# Patient Record
Sex: Female | Born: 1955 | Race: White | Hispanic: No | Marital: Married
Health system: Midwestern US, Academic
[De-identification: ages and names within clinical notes are randomized; demographics above are authoritative.]

## PROBLEM LIST (undated history)

## (undated) LAB — HM COLONOSCOPY: HM Colonoscopy: NORMAL

## (undated) LAB — HM MAMMOGRAPHY

## (undated) LAB — HM PAP SMEAR

---

## 2011-03-21 ENCOUNTER — Inpatient Hospital Stay

## 2011-04-01 ENCOUNTER — Ambulatory Visit: Admit: 2011-04-01 | Payer: PRIVATE HEALTH INSURANCE | Attending: Neurology

## 2011-04-01 DIAGNOSIS — M79603 Pain in arm, unspecified: Secondary | ICD-10-CM

## 2011-04-01 NOTE — Unmapped (Signed)
(  No note.)

## 2011-04-03 NOTE — Unmapped (Signed)
Patient name:  Elaine [name], Elaine [name]  Patient DOB:  [id number]  Date of Study:  04/01/11  Referring Physician:  Dr. Ane Payment    Indication for Referral:  The patient was evaluated for right and left arm mononeuropathies and cervical radiculopathies.    Summary:  The right median palmar sensory distal latency was prolonged. Otherwise, the nerve conduction studies of the upper extremities were normal. The needle exam showed mildly enlarged motor unit potentials in the right triceps muscle. Otherwise, the needle exam of the arms was normal.     Interpretation:   There is EMG evidence of a mild right median neuropathy at the wrist (carpal tunnel syndrome). The mild motor unit potential changes in the right triceps muscle are of uncertain clinical significance but may reflect an old right C7 radiculopathy.      Domenick Gong, M.D.  JGQ/md  T:  04/03/11    Proofed electronically by Dr. Gerlean Ren Conduction Studies (normal values for skin temp. at 34 degrees C)    Motor          Amplitude    Latency     Velocity   F-wave       Comments                 (mV)         (msec)      (m/sec)    (msec)    R median        7.1 (>3.9)  4.4 (<4.5)  54 (>48)  27.6 (<32)   R ulnar        14.8 (>6.5)  2.8 (<3.4)  57 (>53)  28.0 (<33)  L median        8.3 (>3.9)  3.9 (<4.5)  59 (>48)  27.7 (<32)   L ulnar        18.7 (>6.5)  3.0 (<3.4)  62 (>53)  26.1 (<33)    Sensory        Amplitude    Latency     Velocity   F-wave       Comments                 (uV)         (msec)      (m/sec)    (msec)    R med palmar    85 (>49.0)  2.6 (<2.3)  R uln palmar    35 (>15.0)  2.2 (<2.3)  L med palmar   115 (>49.0)  2.2 (<2.3)  L uln palmar    32 (>15.0)  2.0 (<2.3)      Needle Electrode Studies    Muscle IA Fib Fasc Normal Recruitment Dur Amp Turns Comments   L 1st dorsal int nl 0 0 nl        L abd poll brev nl 0 0 nl        L ext dig comm nl 0 0 nl        L pronator teres nl 0 0 nl        L triceps nl 0 0 nl        L deltoid nl 0 0 nl        L cervical  parasp nl 0 0 --                    R 1st dorsal int nl 0 0 nl  R abd poll brev nl 0 0 nl        R ext dig comm nl 0 0 nl        R pronator teres nl 0 0 nl        R triceps nl 0 0   + +     R deltoid nl 0 0 nl        R cervical parasp nl 0 0 --

## 2011-04-04 NOTE — Unmapped (Signed)
Please fax patients EMG results to Dr.Suna at [phone]

## 2011-04-11 ENCOUNTER — Ambulatory Visit: Admit: 2011-04-11 | Payer: PRIVATE HEALTH INSURANCE | Attending: Neurology

## 2011-04-11 DIAGNOSIS — F32A Depression, unspecified: Secondary | ICD-10-CM

## 2011-04-11 NOTE — Unmapped (Signed)
Subjective:      Patient ID: Elaine [name] is a 55 y.o. female.    I saw Elaine [name] today as a consultation requested by Dr. Aletha Halim for arm pain.     HPI Comments: The hand pain is greater on the left than right side. It started more than 10 yrs ago. Has been constant for the last 6 months. It increases  With gardening and using hands and also at work. Flicking hands used to help her symptoms. Currently she rubs her hands. Symptoms awake her frequently at night. She had 4 EMGs in the past. The last EMG done last week showed a mild right CTS and MUP changes in right triceps of unclear significance.     The neck pain is described more as a discomfort and it is very intermittent. She has no radiating pain into arms.     Elaine [name] also reported that her memory appears to be abnormal over the last year. She has joint stiffness and daily headaches. The headaches feel as her head is heavy.    Neck Pain   This is a chronic problem. The current episode started more than 1 year ago. The problem occurs intermittently. The problem has been waxing and waning. The pain is associated with nothing. The quality of the pain is described as aching. The pain is at a severity of 3/10. The pain is mild. Stiffness is present in the morning. Associated symptoms include headaches and tingling. Pertinent negatives include no chest pain, numbness or weakness. She has tried chiropractic manipulation for the symptoms. The treatment provided no relief.   Arm Pain   The incident occurred more than 1 week ago. The incident occurred at work. The injury mechanism was repetitive motion. The pain is present in the left hand and right hand. The quality of the pain is described as burning, aching and stabbing. The pain does not radiate. The pain is at a severity of 9/10. The pain is moderate. The pain has been fluctuating since the incident. Associated symptoms include tingling. Pertinent negatives include no chest pain or numbness. The symptoms are  aggravated by movement. She has tried immobilization for the symptoms. The treatment provided mild relief.   The hand pain is greater on the left than right side. It started more than 10 yrs ago. Has been constant for the last 6 months. It increases  With gardening and using hands and also at work. Flicking hands used to help her symptoms. Currently she rubs her hands. Symptoms awake her frequently at night. She had 4 EMGs in the past. The last EMG done last week showed a mild right CTS and MUP changes in right triceps of unclear significance.               Histories:     She has a past medical history of depression and ADD. Alcohol abuse.    She has a past surgical history of C section.    Her family history includes Cancer in her father and mother. Her family history is also strongly positive for depression.    She reports that she quit smoking about 4 months ago. She does not have any smokeless tobacco history on file. She reports that she does not drink alcohol or use illicit drugs. She quit drinking 3 weeks ago. Used to drink 1-2 glasses of wine and at times till she blacks out, currently she is enrolled in [place]. She used to smoke less than 1 PPD for 15  years.        Review of Systems   HENT: Positive for neck pain and neck stiffness.    Eyes: Positive for visual disturbance.   Respiratory: Negative for shortness of breath.    Cardiovascular: Negative for chest pain.   Gastrointestinal: Negative for diarrhea and constipation.   Genitourinary: Negative for difficulty urinating.   Musculoskeletal: Positive for arthralgias.   Neurological: Positive for tingling, light-headedness and headaches. Negative for dizziness, tremors, seizures, syncope, facial asymmetry, speech difficulty, weakness and numbness.   Psychiatric/Behavioral: Negative for confusion.       Allergies:   Morphine    Medications:     Outpatient Encounter Prescriptions as of 04/11/2011   Medication Sig Dispense Refill   ??? amphetamine-dextroamphetamine  (ADDERALL) 20 mg Tab Take 20 mg by mouth every morning.         ??? FLUoxetine (PROZAC) 40 MG capsule Take 40 mg by mouth daily.              Objective:       Blood pressure 90/60, pulse 60, height 5' 3 (1.6 m), weight 148 lb (67.132 kg).    Neurologic Exam     Mental Status   Oriented to person, place, and time.       Registration: recalls 3 of 3 objects. Recall at 5 minutes: recalls 3 of 3 objects. Follows 3 step commands.   Attention: normal. Concentration: normal.   Speech: speech is normal   Level of consciousness: alert  Knowledge: good. Able to perform simple calculations.   Able to name object. Able to read. Able to repeat. Able to write. Normal comprehension.     Cranial Nerves   Cranial nerves II through XII intact.      CN II   Visual acuity: normal with correction  Right visual field deficit: none  Left visual field deficit: none      CN III, IV, VI   Pupils are equal, round, and reactive to light.  Extraocular motions are normal.      CN V   Facial sensation intact.   Right facial sensation deficit: none  Left facial sensation deficit: none     CN VII   Facial expression full, symmetric.   Right facial weakness: none  Left facial weakness: none     CN VIII   CN VIII normal.   Hearing: intact     CN IX, X   CN IX normal.   CN X normal.      CN XI   CN XI normal.      CN XII   CN XII normal.     Motor Exam   Muscle bulk: normal  Overall muscle tone: normal     Strength   Strength 5/5 throughout.     Sensory Exam   Light touch normal.   Vibration normal.   Proprioception normal.   Pinprick normal.     Gait, Coordination, and Reflexes      Gait  Gait: normal     Coordination   Romberg: negative  Finger to nose coordination: normal  Heel to shin coordination: normal  Tandem walking coordination: normal     Tremor   Resting tremor: absent  Intention tremor: absent  Action tremor: absent     Reflexes   Right brachioradialis: 2+  Left brachioradialis: 2+  Right biceps: 2+  Left biceps: 2+  Right triceps:  2+  Left triceps: 2+  Right patellar: 2+  Left patellar: 2+  Right achilles: 2+  Left achilles: 2+  Right grip: 2+  Left grip: 2+  Right plantar: normal  Left plantar: normal  Right Hoffman: absent  Left Hoffman: absent      Physical Exam   Constitutional: She is oriented to person, place, and time. She appears well-developed and well-nourished.   HENT:   Head: Normocephalic and atraumatic.   Eyes: EOM are normal. Pupils are equal, round, and reactive to light.   Neck: Normal range of motion. Neck supple.   Cardiovascular: Normal rate, regular rhythm and normal heart sounds.    Pulmonary/Chest: Effort normal and breath sounds normal.   Abdominal: Soft. Bowel sounds are normal.   Neurological: She is oriented to person, place, and time. She has normal strength. She has a normal Finger-Nose-Finger Test, a normal Heel to [employer], a normal Romberg Test and a normal Tandem Gait Test. Gait normal.   Reflex Scores:       Tricep reflexes are 2+ on the right side and 2+ on the left side.       Bicep reflexes are 2+ on the right side and 2+ on the left side.       Brachioradialis reflexes are 2+ on the right side and 2+ on the left side.       Patellar reflexes are 2+ on the right side and 2+ on the left side.       Achilles reflexes are 2+ on the right side and 2+ on the left side.  Skin: Skin is warm.   Psychiatric: Her speech is normal.        She is tearful and questioned me several times if I think she is crazy.        Prior Diagnostic Testing:   As per HPI.       Assessment:     1- Bilateral Hand pain radiating to elbow. This is caused by mild right carpal tunnel syndrome.  2- Generalized somatic symptoms including neck discomfort, joint stiffness in the morning, memory complaints, weight gain, daily headaches, intermittent lightheadedness likely due to depression.    Plan:     1-Use wrist braces regularly at night and consider doing light work.  2-Reassurance that her neurologic exam is normal mitigating against a  sinister neurologic diagnosis.  3-Referral to a psychiatrist for management of depression.  4-Follow up on as needed basis.

## 2011-06-07 ENCOUNTER — Encounter: Payer: PRIVATE HEALTH INSURANCE | Attending: Neurology

## 2012-09-28 ENCOUNTER — Inpatient Hospital Stay: Admit: 2012-09-28 | Payer: PRIVATE HEALTH INSURANCE

## 2012-09-28 ENCOUNTER — Ambulatory Visit: Payer: PRIVATE HEALTH INSURANCE

## 2012-09-28 DIAGNOSIS — M5020 Other cervical disc displacement, unspecified cervical region: Secondary | ICD-10-CM

## 2012-09-28 MED ORDER — MAGNEVIST (gadopentetate dimeglumine) 13 mL
469.01 | Freq: Once | INTRAVENOUS | Status: AC | PRN
Start: 2012-09-28 — End: 2012-09-28
  Administered 2012-09-28: 16:00:00 via INTRAVENOUS

## 2012-10-01 NOTE — Unmapped (Signed)
Pt would like to know if Dr Nash Dimmer can review her MRI that she had on Friday 3.7.14 at [facility]. She would like his opinion since he did her EMG back in September

## 2012-10-01 NOTE — Unmapped (Signed)
Phoned patient to let her know Dr. Nash Dimmer would be unable to do this.

## 2012-10-08 ENCOUNTER — Ambulatory Visit: Admit: 2012-10-08 | Payer: PRIVATE HEALTH INSURANCE | Attending: Neurology

## 2012-10-08 DIAGNOSIS — M79609 Pain in unspecified limb: Secondary | ICD-10-CM

## 2012-10-08 NOTE — Unmapped (Signed)
Indication for Referral:  The patient was evaluated for right upper extremity mononeuropathies and radiculopathies.     Summary:  The right median sensory latency was prolonged.  The nerve conduction studies were otherwise normal.  Enlarged motor units were seen in the right abductor pollicis brevis, flexor carpi radialis, extensor digitorum communis and triceps muscles.  The remainder of the needle exam was normal.      Interpretation:  The EMG was abnormal.  The EMG findings best fit with:   1. A chronic right C7 radiculopathy.   2. A mild median mononeuropathy at the wrist (aka carpal tunnel).         - - - - - - - - - - - - - - - - - - - - - - - - - - - - - -  -  Randen Kauth, MD  Diplomate, American Board of Electrodiagnostic Medicine      Sensory NCS      Peak  Amp.1-2 Dist. Vel.      ms ms ??V cm m/s   R MEDIAN PALMAR - ULNAR PALMAR   1. median    palm 1.95 2.70 64.5     2. ulnar      palm 1.40 2.00 32.8         Motor NCS      Nerve / Sites Latency Ampl. Distance Velocity    ms mV cm m/s   R MEDIAN - thenar   1. Wrist 4.20 6.3 7    2. Elbow 8.40 6.0 23 54.8   Path 3 - 1       R ULNAR - Hypothenar   1. Wrist 3.30 14.7 6.5    2. B.Elbow 7.30 14.3 25 62.5   Path 3 - 1           EMG Summary Table     Spontaneous MUAP Recruitment Other    IA Fib PSW Fasc Amp Dur. PPP Pattern Other   R. FIRST D INTEROSS N None None None N N N N None   R. ABD POLL BREVIS N None None None 1+ N N N None   R. FLEX CARPI RAD N None None None 2+ 2+ 1+ N None   R. EXT DIG COMM N None None None 1+ 2+ 1+ N None   R. TRICEPS N None None None 2+ 2+ 1+ N None   R. DELTOID N None None None N N N N None   R. BICEPS N None None None N N N N None   R. CERV PSPINAL N None None None     None       NERVE CONDUCTION STUDY NORMAL VALUES (skin temp. 34??? C)  MOTOR  AMP LAT COND. VEL. F-WAVE       SENSORY AMP LAT COND. VEL.  (mV) (msec)   (m/sec)  (msec)   (mV) (msec)   (m/sec)  Median (>3.9) (<4.5)   (>48)  (<32)       Radial  (>20) (<2.9)   (>   )           Ulnar (>6.5) (<3.4)   (>53)  (<33)       Median (>24.9) (<3.6)   (>56)   Peroneal (>1.9) (<7.1)   (>40)  (< 59)        Ulnar  (>9.6) (<3.0)   (>54)  Tibial (> 3.9) (<5.9)   (>39)  (<57 )       Sural  (>  5.9) (<4.1)   (>    )

## 2012-11-27 ENCOUNTER — Inpatient Hospital Stay: Admit: 2012-11-27 | Payer: PRIVATE HEALTH INSURANCE

## 2012-11-27 DIAGNOSIS — Z01810 Encounter for preprocedural cardiovascular examination: Secondary | ICD-10-CM

## 2013-02-18 ENCOUNTER — Inpatient Hospital Stay: Admit: 2013-02-18 | Payer: PRIVATE HEALTH INSURANCE

## 2013-02-18 DIAGNOSIS — Z9889 Other specified postprocedural states: Secondary | ICD-10-CM

## 2014-01-07 ENCOUNTER — Ambulatory Visit: Admit: 2014-01-07 | Discharge: 2014-01-07 | Payer: PRIVATE HEALTH INSURANCE

## 2014-01-07 DIAGNOSIS — G2581 Restless legs syndrome: Secondary | ICD-10-CM

## 2014-01-07 MED ORDER — ALPRAZolam (XANAX) 0.25 MG tablet
0.25 | ORAL_TABLET | Freq: Every evening | ORAL | Status: AC | PRN
Start: 2014-01-07 — End: 2014-04-22

## 2014-01-07 MED ORDER — celecoxib (CELEBREX) 200 MG capsule
200 | ORAL_CAPSULE | Freq: Every day | ORAL | Status: AC
Start: 2014-01-07 — End: 2014-07-08

## 2014-01-07 MED ORDER — FLUoxetine (PROZAC) 20 MG capsule
20 | ORAL_CAPSULE | Freq: Two times a day (BID) | ORAL | Status: AC
Start: 2014-01-07 — End: 2014-04-22

## 2014-01-08 NOTE — Unmapped (Signed)
stable °

## 2014-01-08 NOTE — Unmapped (Signed)
Xanax hs

## 2014-01-08 NOTE — Unmapped (Signed)
Screening reviewed ordered

## 2014-01-08 NOTE — Unmapped (Signed)
Subjective  HPI:   Patient ID: Elaine [name] is a 58 y.o. female.    Chief Complaint:  HPI Comments: New pt h/o depression restless legs oa, add?             Medications:  Current Outpatient Prescriptions   Medication Sig Dispense Refill   ??? ALPRAZolam (XANAX) 0.25 MG tablet Take 1 tablet (0.25 mg total) by mouth at bedtime as needed.  30 tablet  3   ??? celecoxib (CELEBREX) 200 MG capsule Take 1 capsule (200 mg total) by mouth daily.  30 capsule  5   ??? FLUoxetine (PROZAC) 20 MG capsule Take 1 capsule (20 mg total) by mouth 2 times a day.  60 capsule  11     No current facility-administered medications for this visit.        ROS:   Review of Systems   Constitutional: Negative for fatigue.   All other systems reviewed and are negative.         Objective:   Physical Exam          Filed Vitals:    01/07/14 0934   BP: 104/82   Pulse: 72   Resp: 12   Height: 5' 4.9 (1.648 m)   Weight: 153 lb (69.4 kg)     Body mass index is 25.55 kg/(m^2).  Body surface area is 1.78 meters squared.                Assessment/Plan:     Patient Active Problem List   Diagnosis   ??? Reactive airway disease   ??? Depression   ??? Restless legs        BP 104/82   Pulse 72   Resp 12   Ht 5' 4.9 (1.648 m)   Wt 153 lb (69.4 kg)   BMI 25.55 kg/m2    General Appearance:    Alert, cooperative, no distress, appears stated age   Head:    Normocephalic, without obvious abnormality, atraumatic   Eyes:    PERRL, conjunctiva/corneas clear, EOM's intact, fundi     benign, both eyes   Ears:    Normal TM's and external ear canals, both ears   Nose:   Nares normal, septum midline, mucosa normal, no drainage    or sinus tenderness   Throat:   Lips, mucosa, and tongue normal; teeth and gums normal   Neck:   Supple, symmetrical, trachea midline, no adenopathy;     thyroid:  no enlargement/tenderness/nodules; no carotid    bruit or JVD   Back:     Symmetric, no curvature, ROM normal, no CVA tenderness   Lungs:     Clear to auscultation bilaterally, respirations  unlabored   Chest Wall:    No tenderness or deformity    Heart:    Regular rate and rhythm, S1 and S2 normal, no murmur, rub   or gallop       Abdomen:     Soft, non-tender, bowel sounds active all four quadrants,     no masses, no organomegaly           Extremities:   Extremities normal, atraumatic, no cyanosis or edema   Pulses:   2+ and symmetric all extremities   Skin:   Skin color, texture, turgor normal, no rashes or lesions

## 2014-01-08 NOTE — Unmapped (Signed)
prozac 

## 2014-04-15 ENCOUNTER — Inpatient Hospital Stay: Admit: 2014-04-15 | Payer: PRIVATE HEALTH INSURANCE

## 2014-04-15 ENCOUNTER — Inpatient Hospital Stay: Admit: 2014-04-15

## 2014-04-15 ENCOUNTER — Other Ambulatory Visit: Admit: 2014-04-15 | Payer: PRIVATE HEALTH INSURANCE

## 2014-04-15 DIAGNOSIS — Z0000 Encounter for general adult medical examination without abnormal findings: Secondary | ICD-10-CM

## 2014-04-15 DIAGNOSIS — Z1231 Encounter for screening mammogram for malignant neoplasm of breast: Secondary | ICD-10-CM

## 2014-04-15 LAB — RENAL FUNCTION PANEL W/EGFR
Albumin: 4.1 g/dL (ref 3.5–5.7)
Anion Gap: 4 mmol/L (ref 3–16)
BUN: 19 mg/dL (ref 7–25)
CO2: 31 mmol/L (ref 21–33)
Calcium: 9.5 mg/dL (ref 8.6–10.3)
Chloride: 104 mmol/L (ref 98–110)
Creatinine: 0.82 mg/dL (ref 0.60–1.30)
GFR MDRD Af Amer: 87 See note.
GFR MDRD Non Af Amer: 72 See note.
Glucose: 83 mg/dL (ref 70–100)
Osmolality, Calculated: 289 mOsm/kg (ref 278–305)
Phosphorus: 4.4 mg/dL (ref 2.1–4.7)
Potassium: 4.7 mmol/L (ref 3.5–5.3)
Sodium: 139 mmol/L (ref 133–146)

## 2014-04-15 LAB — CBC
Hematocrit: 42.1 % (ref 35.0–45.0)
Hemoglobin: 14.1 g/dL (ref 11.7–15.5)
MCH: 29 pg (ref 27.0–33.0)
MCHC: 33.5 g/dL (ref 32.0–36.0)
MCV: 86.5 fL (ref 80.0–100.0)
MPV: 8.9 fL (ref 7.5–11.5)
Platelets: 242 10E3/uL (ref 140–400)
RBC: 4.87 10E6/uL (ref 3.80–5.10)
RDW: 14.3 % (ref 11.0–15.0)
WBC: 5.3 10E3/uL (ref 3.8–10.8)

## 2014-04-15 LAB — HEPATIC FUNCTION PANEL, SERUM
ALT: 14 U/L (ref 7–52)
AST (SGOT): 19 U/L (ref 13–39)
Albumin: 4.1 g/dL (ref 3.5–5.7)
Alkaline Phosphatase: 55 U/L (ref 36–125)
Bilirubin, Direct: 0.07 mg/dL (ref 0.00–0.40)
Bilirubin, Indirect: 0.33 mg/dL (ref 0.00–1.10)
Total Bilirubin: 0.4 mg/dL (ref 0.0–1.5)
Total Protein: 6.5 g/dL (ref 6.4–8.9)

## 2014-04-15 LAB — IRON: Iron: 88 ug/dL (ref 50–212)

## 2014-04-15 LAB — THYROID PEROXIDASE ANTIBODY: Thyroid Peroxidase Ab: <10 IU/mL (ref 0.0–34.9)

## 2014-04-15 LAB — FERRITIN: Ferritin: 22.1 ng/mL (ref 11.0–306.8)

## 2014-04-15 LAB — FOLATE: Folic Acid: 14.9 ng/mL (ref 5.90–24.80)

## 2014-04-15 LAB — THYROGLOBULIN ANTIBODY: Thyroglobulin Ab: <20 IU/mL (ref 0.0–39.0)

## 2014-04-15 LAB — VITAMIN B12: Vitamin B-12: 425 pg/mL (ref 180–914)

## 2014-04-15 LAB — TSH: TSH: 1.32 u[IU]/mL (ref 0.34–5.60)

## 2014-04-22 ENCOUNTER — Ambulatory Visit: Admit: 2014-04-22 | Discharge: 2014-04-22 | Payer: PRIVATE HEALTH INSURANCE

## 2014-04-22 DIAGNOSIS — Z0000 Encounter for general adult medical examination without abnormal findings: Secondary | ICD-10-CM

## 2014-04-22 LAB — POCT URINALYSIS DIPSTICK, NONAUTOMATED; W/O MICRO
POCT - Bilirubin, UA: NEGATIVE
POCT - Glucose, UA: NEGATIVE
POCT - Ketones, UA: NEGATIVE
POCT - Leukocytes Esterase, UA: NEGATIVE
POCT - Nitrite, UA: NEGATIVE
POCT - Protein, UA: NEGATIVE
POCT - Specific Gravity, Urine: 1.01
POCT - Urobilinogen, UA: 0.2
POCT - pH, UA: 5

## 2014-04-22 MED ORDER — ALPRAZolam (XANAX) 0.25 MG tablet
0.25 | ORAL_TABLET | Freq: Every evening | ORAL | Status: AC | PRN
Start: 2014-04-22 — End: 2014-12-16

## 2014-04-22 MED ORDER — cyanocobalamin (VITAMIN B-12) injection 1,000 mcg
1000 | INTRAMUSCULAR | Status: AC
Start: 2014-04-22 — End: 2014-05-22
  Administered 2014-04-22: 14:00:00 1000 ug via INTRAMUSCULAR

## 2014-04-22 MED ORDER — folic acid-vit B6-vit B12 2.5-25-1 mg Tab
2.5-25-1 | Freq: Every day | ORAL | 2.00 refills | 30.00000 days | Status: AC
Start: 2014-04-22 — End: 2014-05-22

## 2014-04-22 MED ORDER — amoxicillin-clavulanate (AUGMENTIN) 875-125 mg per tablet
875-125 | ORAL_TABLET | Freq: Two times a day (BID) | ORAL | Status: AC
Start: 2014-04-22 — End: 2014-05-22

## 2014-04-22 MED ORDER — mometasone (NASONEX) 50 mcg/actuation nasal spray
50 | Freq: Every day | NASAL | Status: AC
Start: 2014-04-22 — End: 2014-04-22

## 2014-04-22 MED ORDER — fluticasone (FLONASE) 50 mcg/actuation nasal spray
50 | Freq: Every day | NASAL | Status: AC
Start: 2014-04-22 — End: 2014-07-02

## 2014-04-22 MED ORDER — FLUoxetine (PROZAC) 20 MG capsule
20 | ORAL_CAPSULE | Freq: Every day | ORAL | Status: AC
Start: 2014-04-22 — End: 2015-01-03

## 2014-04-22 MED ORDER — FLUoxetine (PROZAC) 10 MG capsule
10 | ORAL_CAPSULE | Freq: Every day | ORAL | Status: AC
Start: 2014-04-22 — End: 2014-06-23

## 2014-04-22 NOTE — Unmapped (Signed)
Prn xanax

## 2014-04-22 NOTE — Unmapped (Signed)
Check if better after augmentin, b12

## 2014-04-22 NOTE — Unmapped (Signed)
Subjective  HPI:   Patient ID: Elaine [name] is a 58 y.o. female.    Chief Complaint:  HPI Comments: cpe 2 weeks of feeling tired sleeps fine. Feels hot, sweaty keep gettting worse. Used to take adderall went off about 1 year ago she feels ok but bothers others. l nares feels plugged. celebrex helps oa             Medications:  Current Outpatient Prescriptions   Medication Sig Dispense Refill   ??? acyclovir (ZOVIRAX) 400 MG tablet Take 400 mg by mouth daily.       ??? ALPRAZolam (XANAX) 0.25 MG tablet Take 1 tablet (0.25 mg total) by mouth at bedtime as needed.  30 tablet  3   ??? celecoxib (CELEBREX) 200 MG capsule Take 1 capsule (200 mg total) by mouth daily.  30 capsule  5   ??? FLUoxetine (PROZAC) 20 MG capsule Take 1 capsule (20 mg total) by mouth 2 times a day.  60 capsule  11     No current facility-administered medications for this visit.        ROS:   Review of Systems   Constitutional: Positive for fatigue.   All other systems reviewed and are negative.         Objective:   Physical Exam   Constitutional: She is oriented to person, place, and time. She appears well-developed and well-nourished.   HENT:   Head: Normocephalic and atraumatic.   Nose: Nose normal.   Mouth/Throat: Oropharynx is clear and moist.   b tm effusion mild erythema, op erythema, swelling nasal mucosa left   Eyes: Conjunctivae and EOM are normal. Pupils are equal, round, and reactive to light.   Neck: Normal range of motion. Neck supple. No thyromegaly present.   Cardiovascular: Normal rate, regular rhythm, normal heart sounds and intact distal pulses.  Exam reveals no gallop and no friction rub.    No murmur heard.  Pulmonary/Chest: Effort normal and breath sounds normal.   Abdominal: Soft. Bowel sounds are normal. She exhibits no distension and no mass. There is no tenderness.   Musculoskeletal: Normal range of motion. She exhibits no edema.   Neurological: She is alert and oriented to person, place, and time. She has normal reflexes.      Skin: Skin is warm and dry. No rash noted.   Psychiatric: She has a normal mood and affect. Her behavior is normal.             Filed Vitals:    04/22/14 0857   BP: 94/66   Pulse: 64   Resp: 12   Height: 5' 3.6 (1.615 m)   Weight: 158 lb (71.668 kg)     Body mass index is 27.48 kg/(m^2).  Body surface area is 1.79 meters squared.                Assessment/Plan:     Patient Active Problem List   Diagnosis   ??? Reactive airway disease   ??? Depression   ??? Restless legs   ??? Healthcare maintenance

## 2014-04-22 NOTE — Unmapped (Signed)
Nasal spray you ordered is not available until November- can a new med be sent in

## 2014-04-22 NOTE — Unmapped (Signed)
augmentin

## 2014-04-22 NOTE — Unmapped (Signed)
Reduce prozac to 30 mg

## 2014-04-22 NOTE — Unmapped (Signed)
Screening reviewed ordered

## 2014-04-22 NOTE — Unmapped (Signed)
celebrex

## 2014-04-23 NOTE — Unmapped (Signed)
Pt. Informed rx sent to the pharmacy

## 2014-04-23 NOTE — Unmapped (Signed)
Lung Cancer Screening Program Intake Questions:   1. Referred by:  _____Amy Hovermale____________        2. Is this the baseline/initial screening or a follow-up Low Dose CT? ____Baseline____________  3. Date of Birth: _____October 20, 1957___________  4. Is this a current smoker or former smoker? _________former smoker_______________   When did the patient start smoking? _______in college_______________  5. How many packs per day? _____1.5______________  6. If Former Smoker, when did the patient quit smoking? ______2011____________  7. How many pack years? _______38 pack years______________________  Does the patient currently report and of the following symptoms? New onset cough, coughing up blood, chest pain or unintentional weight loss? ____patient said she have back pain from time to time___  ?? If symptomatic, please refer to RN for further evaluation prior to scheduling.  8. Has the patient has prior chest imaging (CTs, X-rays)? If so, where? ______about 10 years ago____  9.  Insurance: _____________________________   10.  Lung Screening Appointment Information    ?? Date: _________________________    ?? Time: _______________   ?? 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 Patient also stated she has been very fatigue the last week.

## 2014-04-28 NOTE — Unmapped (Signed)
Pt had blood work done she stated= Done on 09.22.2015- pt notice that her chlor was not done or horome= did you want her to do this? Call pt

## 2014-04-28 NOTE — Unmapped (Signed)
Lung Cancer Screening Program Assessment:   Referral: Patient was referred by PCP Dr. Gari Crown  Age: 58 yo  Smoking history: Patient began smoking in college.  She reports she quit for ten years then began smoking again.  Patient reports 25 year smoking history of 1.5 packs per day (37.5 pack years). Patient reports she has been smoke free for 4 years following permanent smoking cessation in 2011.  Relevant Medical History: Patient has history of Restless Leg Syndrome, depression, reactive airway disease and most recently increasing fatigue.  Personal History of CA:  Patient has no personal history of cancer.  Symptoms: Patient complains of chronic, raspy cough.  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   Family History of Lung CA:  Patient reports her grandfather had lung cancer.  Her grandfather lived to 15 yo.   Recommendations: Based on current lung screening guidelines, pt is a candidate for lung cancer screening program.   Action: Program explained, risk/benefits discussed, fees, discussed, eligibility assessed.Marland Kitchen   Response:  Patient verbalized understanding.  Insurance: Patient's insurance is Publishing copy scheduled: Appt tentatively scheduled at [employer] at [phone] on 05/08/2014].

## 2014-04-29 NOTE — Unmapped (Signed)
yes

## 2014-05-05 ENCOUNTER — Other Ambulatory Visit: Admit: 2014-05-05 | Payer: PRIVATE HEALTH INSURANCE

## 2014-05-05 DIAGNOSIS — Z0000 Encounter for general adult medical examination without abnormal findings: Secondary | ICD-10-CM

## 2014-05-05 LAB — LIPID PANEL
Cholesterol, Total: 217 mg/dL — ABNORMAL HIGH (ref 0–200)
HDL: 52 mg/dL (ref 23–92)
LDL Cholesterol: 148 mg/dL — ABNORMAL HIGH (ref 0–100)
Triglycerides: 86 mg/dL (ref 48–352)

## 2014-05-08 ENCOUNTER — Inpatient Hospital Stay: Admit: 2014-05-08 | Payer: PRIVATE HEALTH INSURANCE

## 2014-05-08 DIAGNOSIS — Z122 Encounter for screening for malignant neoplasm of respiratory organs: Secondary | ICD-10-CM

## 2014-05-20 NOTE — Unmapped (Signed)
D: Spoke with patient at 12:15 on 05/20/14. Patient had concerns with atherosclerosis noted on screening CT. Denies cardiovascular complaints; Reports regular PCP visits with Dr. Rico Junker    A: Reassurance provided to patient on the role lung cancer screening as a preventative screening to supplement the annual primary care visit in the prevention and management of disease. Education provided on cardiac/disease prevention and management, importance of regular primary care appointments, and anginal sign/symptoms. Encouraged discussion with PCP to medically correlate with PCP medical exam.     R: Pt states she will discuss with Dr. Rico Junker at her appointment on Thursday. Pt wants [place] to contact her next year to schedule her annual LDCT.

## 2014-05-22 ENCOUNTER — Ambulatory Visit: Admit: 2014-05-22 | Discharge: 2014-05-22 | Payer: PRIVATE HEALTH INSURANCE

## 2014-05-22 DIAGNOSIS — E785 Hyperlipidemia, unspecified: Secondary | ICD-10-CM

## 2014-05-22 MED ORDER — coenzyme Q10 100 mg capsule
100 | ORAL_CAPSULE | Freq: Every day | ORAL | 0.00 refills | 30.00000 days | Status: AC
Start: 2014-05-22 — End: ?

## 2014-05-22 MED ORDER — lovastatin (MEVACOR) 10 MG tablet
10 | ORAL_TABLET | Freq: Every day | ORAL | Status: AC
Start: 2014-05-22 — End: 2014-06-23

## 2014-05-22 MED ORDER — budesonide-formoterol (SYMBICORT) 80-4.5 mcg/actuation inhaler
80-4.5 | Freq: Two times a day (BID) | RESPIRATORY_TRACT | Status: AC
Start: 2014-05-22 — End: 2014-11-24

## 2014-05-22 NOTE — Unmapped (Signed)
gxt °

## 2014-05-22 NOTE — Unmapped (Signed)
Instructed pt to continue current meds

## 2014-05-22 NOTE — Unmapped (Signed)
Subjective  HPI:   Patient ID: Elaine [name] is a 58 y.o. female.    Chief Complaint:  HPI Comments: F/u calcifications thoracic aorta chol, oa, gets doe chest pain, f/u depression.Took chol meds in the past but got jt pain so stopped             Medications:  Current Outpatient Prescriptions   Medication Sig Dispense Refill   ??? acyclovir (ZOVIRAX) 400 MG tablet Take 400 mg by mouth daily.       ??? ALPRAZolam (XANAX) 0.25 MG tablet Take 1 tablet (0.25 mg total) by mouth at bedtime as needed.  30 tablet  3   ??? celecoxib (CELEBREX) 200 MG capsule Take 1 capsule (200 mg total) by mouth daily.  30 capsule  5   ??? FLUoxetine (PROZAC) 10 MG capsule Take 1 capsule (10 mg total) by mouth daily.  90 capsule  1   ??? FLUoxetine (PROZAC) 20 MG capsule Take 1 capsule (20 mg total) by mouth daily.  90 capsule  1   ??? fluticasone (FLONASE) 50 mcg/actuation nasal spray Use 1 spray into each nostril daily.  16 g  0   ??? budesonide-formoterol (SYMBICORT) 80-4.5 mcg/actuation inhaler Inhale 2 puffs into the lungs 2 times a day.  1 Inhaler  3   ??? coenzyme Q10 100 mg capsule Take 100 mg by mouth daily.  30 capsule  0   ??? lovastatin (MEVACOR) 10 MG tablet Take 1 tablet (10 mg total) by mouth daily with dinner.  30 tablet  3     No current facility-administered medications for this visit.        ROS:   Review of Systems   Constitutional: Negative for fatigue.   All other systems reviewed and are negative.         Objective:   Physical Exam   Constitutional: She appears well-developed and well-nourished.   HENT:   Head: Normocephalic and atraumatic.   Cardiovascular: Normal rate, regular rhythm and normal heart sounds.  Exam reveals no gallop and no friction rub.    No murmur heard.  Pulmonary/Chest: Effort normal and breath sounds normal.   Abdominal: Soft. Bowel sounds are normal.   Musculoskeletal: Normal range of motion.   Psychiatric: She has a normal mood and affect. Her behavior is normal.             Filed Vitals:    05/22/14 0908    BP: 94/62   Pulse: 60   Resp: 12     There is no weight on file to calculate BMI.  There is no height or weight on file to calculate BSA.                Assessment/Plan:     Patient Active Problem List   Diagnosis   ??? Reactive airway disease   ??? Depression   ??? Restless legs   ??? Healthcare maintenance   ??? OA (osteoarthritis)   ??? Acute ethmoidal sinusitis   ??? Tired

## 2014-05-22 NOTE — Unmapped (Signed)
Start lovastatin coq10 check vit d

## 2014-05-22 NOTE — Unmapped (Signed)
Xanax prn

## 2014-05-22 NOTE — Unmapped (Signed)
symbicort

## 2014-05-29 ENCOUNTER — Inpatient Hospital Stay: Admit: 2014-05-29 | Payer: PRIVATE HEALTH INSURANCE

## 2014-05-29 DIAGNOSIS — R079 Chest pain, unspecified: Secondary | ICD-10-CM

## 2014-05-29 MED ORDER — Tc-99m technetium tetrofosmin (MYOVIEW) 12.2 milli Curie
Freq: Once | INTRAVENOUS | Status: AC | PRN
Start: 2014-05-29 — End: 2014-05-29
  Administered 2014-05-29: 13:00:00 12.2 via INTRAVENOUS

## 2014-05-29 MED ORDER — Tc-99m technetium tetrofosmin (MYOVIEW) 33.9 milli Curie
Freq: Once | INTRAVENOUS | Status: AC | PRN
Start: 2014-05-29 — End: 2014-05-29
  Administered 2014-05-29: 15:00:00 33.9 via INTRAVENOUS

## 2014-06-10 NOTE — Unmapped (Signed)
Pt had a ecg -done at [facility] - done within the last month. Call pt with results.

## 2014-06-10 NOTE — Unmapped (Signed)
State in process? From 11-5 call about

## 2014-06-11 NOTE — Unmapped (Signed)
Called and spoke to [place], she will have someone look at it and call back

## 2014-06-16 NOTE — Unmapped (Signed)
ECG test is now final

## 2014-06-16 NOTE — Unmapped (Signed)
Patient informed

## 2014-06-16 NOTE — Unmapped (Signed)
.  nl

## 2014-06-23 ENCOUNTER — Ambulatory Visit: Admit: 2014-06-23 | Discharge: 2014-06-23 | Payer: PRIVATE HEALTH INSURANCE | Attending: Gynecologic Oncology

## 2014-06-23 DIAGNOSIS — N941 Dyspareunia: Secondary | ICD-10-CM

## 2014-06-23 NOTE — Unmapped (Signed)
Pt calling to discuss a surgery date.  Please call pt back.

## 2014-06-23 NOTE — Unmapped (Signed)
Chief Complaint   [name] presents with   ??? Consult     painful intercourse ref bt Dr. Graciela Husbands          History of Present Illness  Elaine [name] is a 58 year old woman referred by Dr. Graciela Husbands for evaluation of complaints of pelvic pain and dyspareunia for Elaine past 3 years.    Elaine [name] has a history of dyspareunia and pelvic pain for Elaine last 3 years.  Elaine [name] underwent a laparoscopic transperitoneal repair of inguinal hernias with mesh with lysis of adhesions in 2012, and noticed Elaine pain shortly after, unsure if related.  Elaine [name] reports that pelvic pain is a 10/10 and occurs with sexual intercourse every time.  Elaine [name] presented to Dr. Logan Bores office for evaluation in 05/2014, and began hormone pellets 1 week ago.  On 06/10/14, Elaine [name] returned to Elaine office with lower pelvic pain and dysuria, and was treated with antibiotics for a UTI.  A transvaginal ultrasound was performed on 06/13/14, revealing a normal uterus, small fibroid(s), ovaries not seen, and EML not well delineated.  Elaine [name] was then referred to discuss surgical management of symptoms.    Elaine [name] has a family history significant for ovarian cancer in her mother at age 83.  Elaine [name] has had BRCA testing herself, which was negative.  Elaine [name] reports that overall Elaine [name] is doing well. Elaine [name] reports a normal appetite, denying nausea or vomiting. Elaine [name] denies symptoms of abdominal or pelvic pain currently, but has pelvic pain that is intense every time with intercourse.  Elaine [name] also reports constant pelvic pressure.  Elaine [name] denies abdominal bloating, vaginal bleeding or discharge. Elaine [name] reports no changes in her bowel/bladder habits and has no new limitations in her activities, with chronic constipation versus IBS and chronic fatigue.  Denies fevers, chills, chest pain, shortness of breath, or cough.  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  Elaine [name] plans to travel to [place] in mid-January  2016.     Histories   Elaine [name] has a past medical history of Multiple gastric ulcers; Pancreatitis; Depression; and Eating disorder.    Elaine [name] has past surgical history that includes Cervical discectomy; Nasal septum surgery; carpal tunnel right; Hernia repair (Bilateral); uterine ablation; Knee arthroscopy w/ ACL reconstruction (Left); Cholecystectomy; Trigger finger release; Cesarean section (80, 82, 84); Breast surgery; and Tubal ligation.    Her family history includes ADD / ADHD in her son and son; Cancer in her father and mother; Depression in her brother, brother, sister, and sister.    Elaine [name] reports that Elaine [name] quit smoking about 3 years ago. Her smoking use included Cigarettes. Elaine [name] has a 20 pack-year smoking history. Elaine [name] does not have any smokeless tobacco history on file. Elaine [name] reports that Elaine [name] does not drink alcohol or use illicit drugs.    Allergies  Morphine    Medications  Current Outpatient Prescriptions   Medication Sig   ??? acyclovir Take 400 mg by mouth daily.   ??? budesonide-formoterol Inhale 2 puffs into Elaine lungs 2 times a day.   ??? celecoxib Take 1 capsule (200 mg total) by mouth daily.   ??? coenzyme Q10 Take 100 mg by mouth daily.   ??? FLUoxetine Take 1 capsule (20 mg total) by mouth daily.   ??? fluticasone Use 1 spray into each nostril daily.   ??? UNABLE TO FIND Med Name: Testerone pellets   ???  ALPRAZolam Take 1 tablet (0.25 mg total) by mouth at bedtime as needed.     No current facility-administered medications for this visit.       Elaine following portions of Elaine [name]'s history were reviewed and updated as appropriate: allergies, current medications, past family history, past medical history, past social history, past surgical history and problem list.    Review of Systems   Constitutional: Positive for fatigue. Negative for fever, chills, activity change and appetite change.   HENT: Negative for mouth sores and sore throat.    Eyes: Negative for photophobia and visual disturbance.   Respiratory: Negative for  cough, chest tightness and shortness of breath.    Cardiovascular: Negative for chest pain and leg swelling.   Gastrointestinal: Positive for abdominal pain, diarrhea and constipation. Negative for nausea, vomiting, abdominal distention and bloating.   Genitourinary: Positive for pelvic pain and dyspareunia. Negative for dysuria, frequency, vaginal bleeding, vaginal discharge, difficulty urinating and vaginal pain.   Musculoskeletal: Negative for back pain and arthralgias.   Skin: Negative for color change and pallor.   Neurological: Negative for dizziness, weakness, light-headedness and numbness.   Hematological: Negative for adenopathy.   Psychiatric/Behavioral: Negative for depression, confusion and agitation.       Vitals  Blood pressure 102/63, pulse 65, height 5' 4.5 (1.638 m), weight 162 lb (73.483 kg), SpO2 98 %.    Physical Exam   Vitals reviewed.  Constitutional: Elaine [name] is oriented to person, place, and time. Elaine [name] appears well-developed and well-nourished.   HENT:   Head: Normocephalic and atraumatic.   Eyes: Conjunctivae and EOM are normal.   Neck: Normal range of motion.   Cardiovascular: Normal rate, regular rhythm and normal heart sounds.    Pulmonary/Chest: Effort normal and breath sounds normal.   Abdominal: Soft. Elaine [name] exhibits no distension and no mass. There is no tenderness. There is no rebound and no guarding.   Genitourinary:   Normal external genitalia/vulva.  Normal urethral meatus, urethra, bladder, anus and perineum.  Normal vagina and cervix.  Normal retroverted uterus and adnexa.  Tenderness on bimanual exam.  Normal rectum.  There is no nodularity in Elaine posterior culdesac or rectovaginal septum.   Musculoskeletal: Normal range of motion.   Lymphadenopathy:     Elaine [name] has no cervical adenopathy.   Neurological: Elaine [name] is alert and oriented to person, place, and time.   Skin: Skin is warm and dry.   Psychiatric: Elaine [name] has a normal mood and affect. Her behavior is normal. Judgment and thought content  normal.      Neurologic Exam     Mental Status   Oriented to person, place, and time.     Cranial Nerves     CN III, IV, VI   Extraocular motions are normal.       Review of Lab Results  Lab Results   Component Value Date    WBC 5.3 04/15/2014    HGB 14.1 04/15/2014    HCT 42.1 04/15/2014    PLT 242 04/15/2014    CREATININE 0.82 04/15/2014    BUN 19 04/15/2014    PROT 6.5 04/15/2014    ALT 14 04/15/2014    BILITOT 0.4 04/15/2014    CALCIUM 9.5 04/15/2014     Imaging   Nm Myocardial Perf Multi Spect    05/29/2014   FINAL INTERPRETATION There is normal myocardial perfusion at a heart rate of 148 beats/minute.  Overall study quality is excellent.  Scan significance indicates low cardiac risk.  PERFUSION  FINDINGS There is normal left ventricular perfusion with no visual evidence of transient dilation and a normal stress/rest left ventricular volume ratio of 0.74. Elaine sum stress score is 0 (normal: less than 4).    Elaine right ventricle is normal.  FUNCTION FINDINGS Elaine post-stress left ventricular ejection fraction is normal at 71% with an end diastolic volume of 59 mL and end systolic volume of 17 mL. Left ventricular regional wall motion is normal.  TREADMILL EXERCISE TECHNIQUE Indication: Elaine [name] is referred for Elaine evaluation of shortness of breath.   Elaine [name] was not experiencing chest pain at Elaine time of Elaine study on 05/29/14.  Following Elaine intravenous administration of 12.2 millicuries of Tc-51m tetrofosmin with Elaine [name] at rest (heart rate 60, blood pressure 94/68), cardiac SPECT imaging was performed one hour following injection at 0805.   Subsequent treadmill exercise according to Elaine Bruce protocol was performed for 7 minutes to a blood pressure of 118/64 and 90% MPHR.  Elaine [name] received an additional intravenous injection of 33.9 millicuries of Tc-36m tetrofosmin and cardiac SPECT imaging was performed 30 minutes following injection at 0940.  Elaine [name] experienced shortness of breath and no chest  pain during Elaine test.    ECG DATA  Elaine stress ECG is reported separately.   Please refer to Elaine FINAL SCAN INTERPRETATION at Elaine top of this report.  Report Verified by: Lovey Newcomer, M.D. at 05/29/2014 3:48 PM    Investigations Reviewed:   -(2012) Laparoscopic transperitoneal repair of inguinal bulges and pain with proceed mesh  -(06/09/14) Pap: Normal  -(06/13/14) TVUS: normal uterus, small fibroid(s), ovaries not seen, and EML not well delinated    Assessment & Plan  I saw and personally examined Elaine [name] today with my CNP [place]. I discussed Elaine findings and therapeutic plan with Elaine [name]. I repeated, reviewed and and agree with Elaine history of present illness, past medical histories, family history, social history, medication list, and allergies as listed. Elaine review of systems is as noted above. My physical exam confirms Elaine findings listed above. Review of labs, pathology reports, radiograph reports, and medical records confirm Elaine findings noted above. I agree with Elaine assessment and plan as noted above. I have edited Elaine note where appropriate.    My impression is Elaine [name] is a 58 year old woman with pelvic pain and dyspareunia.  We discussed that on exam, Elaine uterus is retroverted, and this retroverted position may be Elaine source of Elaine discomfort that Elaine [name] experiences with intercourse.  WE discussed Elaine differential diagnosis including adenomyosis, which is less likely due to menopause, symptom onsent and imaging, endometriosis, which is also less likely given menopause and symptom onset, as well as anatomic considerations with Elaine size and position of her uterus.  I do feel that based on my clinical exam, Elaine position of her uterus in her posterior pelvis is Elaine cause, which would explain her symptoms primarily with intercourse.  There does not appear to be a vaginal or bladder component of Elaine pain on exam, making vaginismus or cystitis unlikely.  Due to these anatomic  concerns, I do believe that Elaine [name] is unlikely to derive benefit alone from continued medical management.  We discussed recommendations to include surgical management, including a total robotic hysterectomy and bilateral salpingo-oophorectomy, given her age and family history of ovarian cancer in her mother.  Elaine [name] is a suitable candidate for a minimally invasive approach to surgery, and desires Elaine robotic approach.   Elaine [name] was  counseled on Elaine risks and benefits of surgery.  Elaine risks include but are not limited to bleeding, infection, pain, blood clot, blood transfusion, lymphedema, numbness secondary to genitofemoral nerve injury, conversion to a laparotomy, damage to surrounding organs including bladder, bowel, nerves, and vessels, and death.  Elaine [name] accepts these risks and surgical consent was signed.  Her surgery is scheduled for 07/07/14.  [name] will need a  Level 1, low risk visit with Elaine anesthesia department and will need a CBC, Type & Screen & CMP.  All of Elaine [name]'s questions as well as those of her family were answered.  Our contact information was given to Elaine [name] in case any questions arise.    Complexity: High    Face-to-face time today was 65 minutes, of which >50% was spent in care coordination, documentation and counseling.    Edd Fabian, MD    Director, Division of Gynecologic Oncology  [phone]

## 2014-06-23 NOTE — Unmapped (Signed)
RTC to pt to discuss surgery date.  She stated she would like to have surgery on 07/07/14 at [facility].  Will place case request

## 2014-06-24 NOTE — Unmapped (Signed)
Patient called r/t surgery at [facility] on 07/07/2014 at 7:30 am arrival at 5:30 am aware of NPO status Also spoke to [place] at preadmission of needed pat's she will call patient Patient verbalized understanding

## 2014-06-27 NOTE — Unmapped (Signed)
Pt called stating that she rec'd a letter from her insurance company regarding her upcoming surgery.  Asked pt to give me the information and I can expedite the request, as we have not rec'd a letter from her insurance.  She stated that the insurance company is requesting more information before approving the Robotic Hysterectomy, BSO, Possible lysis of adhesions.  Pt stated the letter requests any diagnostic test results, previous medication and treatment, and any other documents that would be pertinent.      After reviewing pt's chart and outside medical hx that we have scanned, I called the precert dept [phone]) of pt's insurance using reference # [id number] given by pt.  Insurance company wanted EMB results, if any.  The pt has not had an EMB with [place], and the referring MD did not mention performing one either.  I stated that she hadn't had an EMB.  I was then sent to the Nurse, whom stated that she will have to send this on to be reviewed.  I asked if there was any documentation that they needed, and she stated that they have her records.  Nurse told me that once reviewed (within 36-72 hours), our office will receive a phone call with determination.  Expressed understanding.  End of phone conversation with insurance company.

## 2014-07-02 ENCOUNTER — Institutional Professional Consult (permissible substitution): Admit: 2014-07-02 | Payer: PRIVATE HEALTH INSURANCE

## 2014-07-02 DIAGNOSIS — R102 Pelvic and perineal pain: Secondary | ICD-10-CM

## 2014-07-02 LAB — BASIC METABOLIC PANEL
Anion Gap: 6 mmol/L (ref 3–16)
BUN: 6 mg/dL (ref 7–25)
CO2: 28 mmol/L (ref 21–33)
Calcium: 9.3 mg/dL (ref 8.6–10.3)
Chloride: 99 mmol/L (ref 98–110)
Creatinine: 0.84 mg/dL (ref 0.60–1.30)
GFR MDRD Af Amer: 84 See note.
GFR MDRD Non Af Amer: 70 See note.
Glucose: 81 mg/dL (ref 70–100)
Osmolality, Calculated: 273 mOsm/kg (ref 278–305)
Potassium: 4.6 mmol/L (ref 3.5–5.3)
Sodium: 133 mmol/L (ref 133–146)

## 2014-07-02 LAB — HEPATIC FUNCTION PANEL
ALT: 21 U/L (ref 7–52)
AST: 24 U/L (ref 13–39)
Albumin: 4 g/dL (ref 3.5–5.7)
Alkaline Phosphatase: 43 U/L (ref 36–125)
Bilirubin, Direct: 0.1 mg/dL (ref 0.0–0.4)
Bilirubin, Indirect: 0.7 mg/dL (ref 0.0–1.1)
Total Bilirubin: 0.8 mg/dL (ref 0.0–1.5)
Total Protein: 6.6 g/dL (ref 6.4–8.9)

## 2014-07-02 LAB — DIFFERENTIAL
Basophils Absolute: 23 /uL (ref 0–200)
Basophils Relative: 0.5 % (ref 0.0–1.0)
Eosinophils Absolute: 189 /uL (ref 15–500)
Eosinophils Relative: 4.2 % (ref 0.0–8.0)
Lymphocytes Absolute: 2016 /uL (ref 850–3900)
Lymphocytes Relative: 44.8 % (ref 15.0–45.0)
Monocytes Absolute: 356 /uL (ref 200–950)
Monocytes Relative: 7.9 % (ref 0.0–12.0)
Neutrophils Absolute: 1917 /uL (ref 1500–7800)
Neutrophils Relative: 42.6 % (ref 40.0–80.0)

## 2014-07-02 LAB — CBC
Hematocrit: 43.2 % (ref 35.0–45.0)
Hemoglobin: 14.5 g/dL (ref 11.7–15.5)
MCH: 29.1 pg (ref 27.0–33.0)
MCHC: 33.4 g/dL (ref 32.0–36.0)
MCV: 87 fL (ref 80.0–100.0)
MPV: 8 fL (ref 7.5–11.5)
Platelets: 274 10*3/uL (ref 140–400)
RBC: 4.97 10*6/uL (ref 3.80–5.10)
RDW: 13.4 % (ref 11.0–15.0)
WBC: 4.5 10*3/uL (ref 3.8–10.8)

## 2014-07-02 LAB — ABO/RH: Rh Type: POSITIVE

## 2014-07-02 LAB — ANTIBODY SCREEN: Antibody Screen: NEGATIVE

## 2014-07-02 NOTE — Unmapped (Signed)
Pre-Procedure Instructions    We???re pleased that you have chosen [facility] for your upcoming procedure.  The staff serving you is professionally trained to provide the highest quality care.  We encourage you to ask questions and to let the staff know your special needs.  We want your visit to be as comfortable as possible.    Your procedure is scheduled on 12/14 at 0730 AM.  Please arrive at 0530 AM and check in at the surgery waiting room on the second floor of the main hospital.    Directions to Surgery Area- do not stop at [employer] on Main Floor    1) Main entrance of the hospital to the elevators on the left (across from the gift shop)  2) Elevator to the second floor.  3) Left off elevator to the Surgical Services Desk.    FOLLOW YOUR PHYSICIAN'S INSTRUCTIONS CONCERNING DISCONTINUING ASPIRIN, NSAIDS (non-steroidal anti-inflammatories such as Ibuprofen, Advil and Naproxen), SUPPLEMENTS, FISH OIL, VITAMINS, AND HERBAL SUPPLEMENTS.  ACETAMINOPHEN (TYLENOL) IS OK.    ??? DO NOT EAT OR DRINK ANYTHING (including gum, mints, water, etc.) after midnight the night before your procedure.  You may brush your teeth and gargle on the morning of surgery, but do not swallow any water with the exception of the following medication: n/a (with a small sip of water).    ??? Please make transportation arrangements and bring a responsible adult to accompany you home and remain with you for 24 hours.  ??? We recommend that you leave valuables (i.e. money, jewelry, credit cards) at home.  If you wear glasses or contacts, bring a case for safekeeping.  ??? Wear casual, loose fitting, and comfortable clothing.  A gown will be provided.  If you are staying overnight, bring a small overnight bag.  (Storage space is limited.)  ??? Please remove all makeup, jewelry, body piercings, powder, lotions, perfume/cologne, and nail polish before you arrive.  ??? Bring a list of your medications and dose including herbal.  Do not bring any pills  or medications to the hospital. (Exception: transplant patients.)  ??? Bring a photo ID and your insurance card so we can bill your insurance company directly.  ??? Please do not bring any children under the age of 79 to the hospital.  ??? Do not shave in the area of the surgery for 2 days prior to surgery.  If needed, a trained staff member will clip the area immediately before your surgery.  ??? If you have a cold or are sick prior to surgery, contact your surgeon before surgery.  ??? Please shower at home the evening before and the morning of surgery using an antibacterial soap.      Hibiclens  You will be provided hibiclens for preop shower.  You will use this soap to your operative site once a day for 3 days prior to surgery and the morning of surgery.  DO NOT APPLY THIS SOAP ABOVE YOUR NECK.    Antibacterial showering and good hand hygiene are essential to prevent surgical site infections and reduce the spread of MRSA.  Please take a shower the morning of surgery using an antibacterial soap.  Patient verbalized understanding of these instructions.    Make sure all of your health care givers are checking your ID bracelet and verifying your name and date of birth.  You will actively be involved in verifying the type of surgery you are having and the correct site.  Your health care givers should  be cleaning their hands with soap and water or antibacterial foam before taking care of you and if they do not it is ok to remind them to do so.      In an effort to reduce the risks of blood clots after your surgery you will have compression sleeves on your lower legs.  These sleeves help facilitate circulation and decrease the chances of developing any blood clots.       Patient/Family provided education about surgical site infection prevention.    Contact information:    [facility] Pre-admission Testing,  Monday - Friday 8:00 am - 4:30 pm,   [phone].    If you need to reach someone outside of regular business  hours regarding your surgery please call your surgeon or   [facility] at [phone].

## 2014-07-03 NOTE — Unmapped (Signed)
[employer]CT [employer] on 07/02/14 at 1230 and again today to try to set up peer to peer to appeal surgery denial.  LMOVM to schedule peer to peer both times.

## 2014-07-03 NOTE — Unmapped (Signed)
[employer] again to try to expedite a peer to peer but was informed that this can not take place.  Was informed that I may request an appeal via fax only.  Will fax appeal today.

## 2014-07-04 NOTE — Unmapped (Signed)
Patients insurance denied her appeal I called patient to inform her Dr Charline Bills and Jacklynn Barnacle notified

## 2014-07-04 NOTE — Unmapped (Signed)
RTC to [employer] Amy, No answer, LMOVM to return call.

## 2014-07-04 NOTE — Unmapped (Signed)
Insurance carrier calling to speak with NP.  Please call Amy back at [phone] regarding pt's surgery on Monday.

## 2014-07-07 NOTE — Unmapped (Signed)
Gwen from [employer] called to report that the peer to peer denial was upheld by the physician. An appeal may be made. An appeal letter may be faxed to [phone]. The face sheet of the letter must state that an appeal is being requested. Any questions, call [employer] at [phone].

## 2014-07-08 ENCOUNTER — Ambulatory Visit: Admit: 2014-07-08 | Discharge: 2014-07-08 | Payer: PRIVATE HEALTH INSURANCE

## 2014-07-08 DIAGNOSIS — F32A Depression, unspecified: Secondary | ICD-10-CM

## 2014-07-08 MED ORDER — dextroamphetamine-amphetamine (AMPHETAMINE-DEXTROAMPHETAMINE) 20 mg Tab
20 | ORAL_TABLET | Freq: Two times a day (BID) | ORAL | Status: AC
Start: 2014-07-08 — End: 2014-07-08

## 2014-07-08 MED ORDER — celecoxib (CELEBREX) 200 MG capsule
200 | ORAL_CAPSULE | Freq: Every day | ORAL | Status: AC
Start: 2014-07-08 — End: 2014-10-13

## 2014-07-08 MED ORDER — acyclovir (ZOVIRAX) 400 MG tablet
400 | ORAL_TABLET | Freq: Every day | ORAL | Status: AC
Start: 2014-07-08 — End: 2015-04-14

## 2014-07-08 MED ORDER — dextroamphetamine-amphetamine (AMPHETAMINE-DEXTROAMPHETAMINE) 20 mg Tab
20 | ORAL_TABLET | Freq: Two times a day (BID) | ORAL | Status: AC
Start: 2014-07-08 — End: 2014-11-24

## 2014-07-08 NOTE — Unmapped (Signed)
Pt calling to see what the next step is for her since insurance denied her surgery.  She would like to get in to see Dr E before the end of the year to discuss and do any biopsies, etc.  She still wants to proceed with surgery in the future, so she is wondering what needs to be done in order for insurance to cover the surgery.  Please call pt and advise.

## 2014-07-08 NOTE — Unmapped (Signed)
Will discuss with Dr. Charline Bills and will call patient once I have spoken with him

## 2014-07-09 NOTE — Unmapped (Signed)
Instructed pt to continue current meds

## 2014-07-09 NOTE — Unmapped (Signed)
Subjective  HPI:   Patient ID: Elaine [name] is a 58 y.o. female.    Chief Complaint:  HPI Comments: F/u anxiety, depression hsv, feels she needs to restart adderall having much more trouble focusing             Medications:  Current Outpatient Prescriptions   Medication Sig Dispense Refill   ??? acyclovir (ZOVIRAX) 400 MG tablet Take 1 tablet (400 mg total) by mouth daily. 30 tablet 5   ??? ALPRAZolam (XANAX) 0.25 MG tablet Take 1 tablet (0.25 mg total) by mouth at bedtime as needed. 30 tablet 3   ??? budesonide-formoterol (SYMBICORT) 80-4.5 mcg/actuation inhaler Inhale 2 puffs into the lungs 2 times a day. 1 Inhaler 3   ??? celecoxib (CELEBREX) 200 MG capsule Take 1 capsule (200 mg total) by mouth daily. 30 capsule 5   ??? coenzyme Q10 100 mg capsule Take 100 mg by mouth daily. 30 capsule 0   ??? dextroamphetamine-amphetamine (AMPHETAMINE-DEXTROAMPHETAMINE) 20 mg Tab Take 1 tablet (20 mg total) by mouth 2 times a day. Fill after 08/08/13 60 tablet 0   ??? ferrous fumarate-b12-vitamic C-folic acid (FOLTRIN) 110-0.5 mg capsule Take 1 capsule by mouth at bedtime.     ??? FLUoxetine (PROZAC) 20 MG capsule Take 1 capsule (20 mg total) by mouth daily. 90 capsule 1   ??? UNABLE TO FIND Med Name: Testerone pellets       No current facility-administered medications for this visit.        ROS:   Review of Systems   Constitutional: Negative for fatigue.   All other systems reviewed and are negative.         Objective:   Physical Exam   Constitutional: She appears well-developed and well-nourished.   HENT:   Head: Normocephalic and atraumatic.   Cardiovascular: Normal rate, regular rhythm and normal heart sounds.  Exam reveals no gallop and no friction rub.    No murmur heard.  Pulmonary/Chest: Effort normal and breath sounds normal.   Abdominal: Soft. Bowel sounds are normal.   Musculoskeletal: Normal range of motion.   Psychiatric: She has a normal mood and affect. Her behavior is normal.             Filed Vitals:    07/08/14 1209   BP:  118/76   Pulse: 64   Resp: 12   Height: 5' 3.6 (1.615 m)   Weight: 156 lb (70.761 kg)     Body mass index is 27.13 kg/(m^2).  Body surface area is 1.78 meters squared.                Assessment/Plan:     Patient Active Problem List   Diagnosis   ??? Reactive airway disease   ??? Depression   ??? Restless legs   ??? Healthcare maintenance   ??? OA (osteoarthritis)   ??? Hyperlipidemia   ??? Chest pain   ??? Pelvic pain in female   ??? Dyspareunia   ??? Family history of ovarian cancer

## 2014-07-14 ENCOUNTER — Ambulatory Visit: Admit: 2014-07-14 | Discharge: 2014-07-14 | Payer: PRIVATE HEALTH INSURANCE | Attending: Gynecologic Oncology

## 2014-07-14 DIAGNOSIS — R102 Pelvic and perineal pain: Secondary | ICD-10-CM

## 2014-07-14 NOTE — Unmapped (Signed)
Chief Complaint   Patient presents with   ??? Follow-up          History of Present Illness  Elaine [name] is a 58 year old woman referred by Dr. Graciela Husbands for evaluation of complaints of pelvic pain and dyspareunia for the past 3 years, seen today for follow up.    Ms. Elaine [name] has a history of dyspareunia and pelvic pain for the last 3 years.  She underwent a uterine ablation in 2011 to cease her periods prior to her recent marriage, without bleeding since that time.  She underwent a laparoscopic transperitoneal repair of inguinal hernias with mesh with lysis of adhesions in 2012, and noticed the pain shortly after, unsure if related.  She reports that pelvic pain is a 10/10 and occurs with sexual intercourse every time.  For management for this pain, she has tried tylenol and ibuprofen without relief of symptoms.  She also notices some difficulty with bowel movements at times, that she feels is related to positioning.  She presented to Dr. Logan Bores office for evaluation in 05/2014, and began hormone pellets a few weeks ago, without relied of pelvic pain symptoms.  Her only other history of hormone therapy is with progesterone 15+ years ago, which she did not tolerate well due to irritability while taking medication.  On 06/10/14, she returned to the office with lower pelvic pain and dysuria, and was treated with antibiotics for a UTI, which has now resolved and no further symptoms.  A transvaginal ultrasound was performed on 06/13/14, revealing a normal uterus, small fibroid(s), ovaries not seen, and EML not well delineated.  She was then referred to discuss surgical management of symptoms.    Ms. Elaine [name] has a family history significant for ovarian cancer in her mother at age 42.  She has had BRCA testing herself, which was negative.  She reports a stable psychological history, and currently takes Prozac 20 mg daily. This dose has been increased twice, once after a divorce and once at time of Mother's death,  and then decreased back to 20 mg after resolve of life event.  She has a stable mood, and has not noticed improvement of pelvic pain symptoms with taking anti-depressant.  She denies other antidepressant use or pain medications.  She also currently takes adderall for ADD, which helps her attention disorder.  She has a history of a colonoscopy 5 years ago, revealing a polyp that was removed, and repeated 3 years ago, which was normal.  She denies a significant bladder history, with two UTIs throughout her life.  She denies any unplanned or recent hospital admissions.    She reports that overall she is doing well. She reports a normal appetite, denying nausea or vomiting. She denies symptoms of abdominal or pelvic pain currently, but has pelvic pain that is intense every time with intercourse.  She also reports constant pelvic pressure.  She denies abdominal bloating, vaginal bleeding or discharge. She reports no changes in her bowel/bladder habits and has no new limitations in her activities, with chronic constipation versus IBS and chronic fatigue.  Denies fevers, chills, chest pain, shortness of breath, or cough.  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     Patient Active Problem List    Diagnosis   ??? ADD (attention deficit disorder)   ??? HSV infection   ??? Pelvic pain in female  Note Last Updated: 06/23/2014     -(2012) Laparoscopic transperitoneal repair of inguinal bulges and pain with proceed mesh  -(2012) Began to have symptoms of pelvic pain, dyspareunia  -(06/09/14) Pap: Normal  -(06/10/14) Seen in office for complaints of new onset lower pelvic pain, dysuria, likely UTI-treated with Cipro.  Started hormone pellets.  -(06/13/14) TVUS: normal uterus, small fibroid(s), ovaries not seen, and EML not well delinated       ??? Dyspareunia   ??? Family history of ovarian cancer   ??? Hyperlipidemia   ??? Chest pain   ??? OA  (osteoarthritis)   ??? Healthcare maintenance   ??? Reactive airway disease   ??? Depression   ??? Restless legs       Histories   She has a past medical history of Multiple gastric ulcers; Pancreatitis; Depression; Eating disorder; PONV (postoperative nausea and vomiting); and Wears glasses.    She has past surgical history that includes Cervical discectomy; Nasal septum surgery; carpal tunnel right; Hernia repair (Bilateral); uterine ablation; Knee arthroscopy w/ ACL reconstruction (Left); Cholecystectomy; Trigger finger release; Cesarean section (80, 82, 84); Breast surgery; and Tubal ligation.    Her family history includes ADD / ADHD in her son and son; Cancer in her father and mother; Depression in her brother, brother, sister, and sister.    She reports that she quit smoking about 3 years ago. Her smoking use included Cigarettes. She has a 20 pack-year smoking history. She has never used smokeless tobacco. She reports that she does not drink alcohol or use illicit drugs.    Allergies  Morphine    Medications  Current Outpatient Prescriptions   Medication Sig   ??? acyclovir Take 1 tablet (400 mg total) by mouth daily.   ??? ALPRAZolam Take 1 tablet (0.25 mg total) by mouth at bedtime as needed.   ??? budesonide-formoterol Inhale 2 puffs into the lungs 2 times a day.   ??? celecoxib Take 1 capsule (200 mg total) by mouth daily.   ??? coenzyme Q10 Take 100 mg by mouth daily.   ??? dextroamphetamine-amphetamine Take 1 tablet (20 mg total) by mouth 2 times a day. Fill after 08/08/13   ??? ferrous fumarate-b12-vitamic C-folic acid Take 1 capsule by mouth at bedtime.   ??? FLUoxetine Take 1 capsule (20 mg total) by mouth daily.   ??? UNABLE TO FIND Med Name: Testerone pellets     No current facility-administered medications for this visit.       The following portions of the patient's history were reviewed and updated as appropriate: allergies, current medications, past family history, past medical history, past social history, past surgical  history and problem list.    Review of Systems   Constitutional: Positive for fatigue. Negative for fever, chills, activity change and appetite change.   HENT: Negative for mouth sores and sore throat.    Eyes: Negative for photophobia and visual disturbance.   Respiratory: Negative for cough, chest tightness and shortness of breath.    Cardiovascular: Negative for chest pain and leg swelling.   Gastrointestinal: Positive for abdominal pain, diarrhea and constipation. Negative for nausea, vomiting, abdominal distention and bloating.   Genitourinary: Positive for pelvic pain and dyspareunia. Negative for dysuria, frequency, vaginal bleeding, vaginal discharge, difficulty urinating and vaginal pain.   Musculoskeletal: Negative for back pain and arthralgias.   Skin: Negative for color change and pallor.   Neurological: Negative for dizziness, weakness, light-headedness and numbness.   Hematological: Negative for adenopathy.   Psychiatric/Behavioral: Negative  for depression, confusion and agitation.       Vitals  Blood pressure 93/78, pulse 78, height 5' 3.5 (1.613 m), weight 157 lb 12.8 oz (71.578 kg), SpO2 97 %.    Physical Exam   Vitals reviewed.  Constitutional: She is oriented to person, place, and time. She appears well-developed and well-nourished.   HENT:   Head: Normocephalic and atraumatic.   Eyes: Conjunctivae and EOM are normal.   Neck: Normal range of motion.   Cardiovascular: Normal rate, regular rhythm and normal heart sounds.    Pulmonary/Chest: Effort normal and breath sounds normal.   Abdominal: Soft. She exhibits no distension and no mass. There is no tenderness. There is no rebound and no guarding.   Genitourinary:   Normal external genitalia/vulva.  Normal urethral meatus, urethra, bladder, anus and perineum.  Normal vagina and cervix.  Normal retroverted uterus and adnexa.  Tenderness on bimanual exam.  Normal rectum.  There is no nodularity in the posterior culdesac or rectovaginal septum.      Musculoskeletal: Normal range of motion.   Lymphadenopathy:     She has no cervical adenopathy.   Neurological: She is alert and oriented to person, place, and time.   Skin: Skin is warm and dry.   Psychiatric: She has a normal mood and affect. Her behavior is normal. Judgment and thought content normal.      Neurologic Exam     Mental Status   Oriented to person, place, and time.     Cranial Nerves     CN III, IV, VI   Extraocular motions are normal.       ENDOMETRIAL BIOPSY PROCEDURE  Lysbeth Dicola is a 58 yo woman who presents for endometrial biopsy.  Indication for this procedure is pelvic pain.  A complete discussion of the endometrial biopsy procedure was held with the patient.  All questions were answered.    The patient was positioned is lithotomy.  A speculum was placed and the cervix was swabbed with Betadine.  A tenaculum was placed on the anterior cervical lip.  The Pipelle was placed to the uterine fundus which sounded to 7 cm.  1 pass of the biopsy instrument resulted in adequate tissue retrieval.  The tenaculum was removed and hemostasis was ensured.  The speculum was removed.  The patient tolerated the procedure well.    Review of Lab Results  Lab Results   Component Value Date    WBC 4.5 07/02/2014    HGB 14.5 07/02/2014    HCT 43.2 07/02/2014    PLT 274 07/02/2014    CREATININE 0.84 07/02/2014    BUN 6* 07/02/2014    PROT 6.6 07/02/2014    AST 24 07/02/2014    ALT 21 07/02/2014    BILITOT 0.8 07/02/2014    CALCIUM 9.3 07/02/2014     Imaging   Nm Myocardial Perf Multi Spect    05/29/2014   FINAL INTERPRETATION There is normal myocardial perfusion at a heart rate of 148 beats/minute.  Overall study quality is excellent.  Scan significance indicates low cardiac risk.  PERFUSION FINDINGS There is normal left ventricular perfusion with no visual evidence of transient dilation and a normal stress/rest left ventricular volume ratio of 0.74. The sum stress score is 0 (normal: less than 4).    The  right ventricle is normal.  FUNCTION FINDINGS The post-stress left ventricular ejection fraction is normal at 71% with an end diastolic volume of 59 mL and end systolic volume of  17 mL. Left ventricular regional wall motion is normal.  TREADMILL EXERCISE TECHNIQUE Indication: The patient is referred for the evaluation of shortness of breath.   The patient was not experiencing chest pain at the time of the study on 05/29/14.  Following the intravenous administration of 12.2 millicuries of Tc-39m tetrofosmin with the patient at rest (heart rate 60, blood pressure 94/68), cardiac SPECT imaging was performed one hour following injection at 0805.   Subsequent treadmill exercise according to the Bruce protocol was performed for 7 minutes to a blood pressure of 118/64 and 90% MPHR.  The patient received an additional intravenous injection of 33.9 millicuries of Tc-9m tetrofosmin and cardiac SPECT imaging was performed 30 minutes following injection at 0940.  The patient experienced shortness of breath and no chest pain during the test.    ECG DATA  The stress ECG is reported separately.   Please refer to the FINAL SCAN INTERPRETATION at the top of this report.  Report Verified by: Lovey Newcomer, M.D. at 05/29/2014 3:48 PM    Investigations Reviewed:   -(2012) Laparoscopic transperitoneal repair of inguinal bulges and pain with proceed mesh  -(06/09/14) Pap: Normal  -(06/13/14) TVUS: normal uterus, small fibroid(s), ovaries not seen, and EML not well delinated    Assessment & Plan  I saw and personally examined the patient today with my CNP [place]. I discussed the findings and therapeutic plan with the patient. I repeated, reviewed and and agree with the history of present illness, past medical histories, family history, social history, medication list, and allergies as listed. The review of systems is as noted above. My physical exam confirms the findings listed above. Review of labs, pathology reports,  radiograph reports, and medical records confirm the findings noted above. I agree with the assessment and plan as noted above. I have edited the note where appropriate.    My impression is Aniela Caniglia is a 58 year old woman with pelvic pain and dyspareunia.  We discussed that on exam, the uterus is retroverted, and this retroverted position may be the source of the discomfort that she experiences with intercourse, as well as causing difficulty for bowel movements due to the position of the uterus.  We discussed the differential diagnosis including adenomyosis, which is less likely due to menopause, symptom onsent and imaging, endometriosis, which is also less likely given menopause and symptom onset, as well as anatomic considerations with the size and position of her uterus.  We also discussed the possibility of endometriosis, and an endometrial biopsy was performed today to rule this out as a cause.  I do feel that based on my clinical exam, the position of her uterus in her posterior pelvis is the cause, which would explain her symptoms primarily with intercourse.  There does not appear to be a vaginal or bladder component of the pain on exam, making vaginismus or cystitis unlikely, and she does not have a significant bowel/bladder history to indicate this either.  Due to these anatomic concerns, I do believe that she is unlikely to derive benefit alone from continued medical management.  Once we have received the results of the endometrial biopsy, if normal results, we discussed recommendations to include surgical management, including a total robotic hysterectomy and bilateral salpingo-oophorectomy, given her age and family history of ovarian cancer in her mother.  The patient is a suitable candidate for a minimally invasive approach to surgery, and desires the robotic approach.   She was counseled on the risks and benefits  of surgery.  The risks include but are not limited to bleeding, infection, pain, blood  clot, blood transfusion, lymphedema, numbness secondary to genitofemoral nerve injury, conversion to a laparotomy, damage to surrounding organs including bladder, bowel, nerves, and vessels, and death.  She accepts these risks and surgical consent was signed.  Her surgery is scheduled for 07/07/14.  Patient will need a  Level 1, low risk visit with the anesthesia department and will need a CBC, Type & Screen & CMP.  All of the patient's questions as well as those of her family were answered.  Our contact information was given to the patient in case any questions arise.    Complexity: Moderate    Face-to-face time today was 30 minutes, of which >50% was spent in care coordination, documentation and counseling.    Edd Fabian, MD    Director, Division of Gynecologic Oncology  [phone]

## 2014-07-21 NOTE — Unmapped (Signed)
TCT pt to inform of negative biopsy results.  She voiced understanding.  We will work on letter to insurance for surgery, and update her on the process.

## 2014-07-28 ENCOUNTER — Encounter: Payer: PRIVATE HEALTH INSURANCE | Attending: Gynecologic Oncology

## 2014-10-09 NOTE — Unmapped (Signed)
Celebrex 200 mg costs to much so patient would like     Meloxicam    [facility]  [phone]

## 2014-10-13 NOTE — Unmapped (Signed)
Not seen since oct? appt

## 2014-10-13 NOTE — Unmapped (Signed)
Pt is calling back again to see what you recommend. Call pt

## 2014-10-14 MED ORDER — meloxicam (MOBIC) 15 MG tablet
15 | ORAL_TABLET | Freq: Every day | ORAL | Status: AC
Start: 2014-10-14 — End: 2014-11-10

## 2014-10-14 NOTE — Unmapped (Signed)
Called pt.  Left message informing new rx sent to the pharmacy

## 2014-10-14 NOTE — Unmapped (Signed)
Pt seen you in December. Does she still need an appt?

## 2014-11-10 ENCOUNTER — Ambulatory Visit: Admit: 2014-11-10 | Discharge: 2014-11-10 | Payer: PRIVATE HEALTH INSURANCE

## 2014-11-10 ENCOUNTER — Ambulatory Visit: Admit: 2014-11-10 | Payer: PRIVATE HEALTH INSURANCE

## 2014-11-10 DIAGNOSIS — L509 Urticaria, unspecified: Secondary | ICD-10-CM

## 2014-11-10 LAB — COMPREHENSIVE METABOLIC PANEL
ALT: 15 U/L (ref 7–52)
AST: 24 U/L (ref 13–39)
Albumin: 3.8 g/dL (ref 3.5–5.7)
Alkaline Phosphatase: 58 U/L (ref 36–125)
Anion Gap: 6 mmol/L (ref 3–16)
BUN: 11 mg/dL (ref 7–25)
CO2: 30 mmol/L (ref 21–33)
Calcium: 9.1 mg/dL (ref 8.6–10.3)
Chloride: 103 mmol/L (ref 98–110)
Creatinine: 0.66 mg/dL (ref 0.60–1.30)
Glucose: 85 mg/dL (ref 70–100)
Osmolality, Calculated: 287 mosm/kg (ref 278–305)
Potassium: 4.6 mmol/L (ref 3.5–5.3)
Sodium: 139 mmol/L (ref 133–146)
Total Bilirubin: 0.8 mg/dL (ref 0.0–1.5)
Total Protein: 6.2 g/dL — ABNORMAL LOW (ref 6.4–8.9)
eGFR AA CKD-EPI: >90 See note.
eGFR NONAA CKD-EPI: >90 See note.

## 2014-11-10 LAB — CBC
Hematocrit: 41.2 % (ref 35.0–45.0)
Hemoglobin: 14.1 g/dL (ref 11.7–15.5)
MCH: 29.4 pg (ref 27.0–33.0)
MCHC: 34.2 g/dL (ref 32.0–36.0)
MCV: 86.1 fL (ref 80.0–100.0)
MPV: 8.4 fL (ref 7.5–11.5)
Platelets: 249 10*3/uL (ref 140–400)
RBC: 4.78 10*6/uL (ref 3.80–5.10)
RDW: 13.8 % (ref 11.0–15.0)
WBC: 8 10*3/uL (ref 3.8–10.8)

## 2014-11-10 LAB — DIFFERENTIAL
Basophils Absolute: 40 /uL (ref 0–200)
Basophils Relative: 0.5 % (ref 0.0–1.0)
Eosinophils Absolute: 888 /uL — ABNORMAL HIGH (ref 15–500)
Eosinophils Relative: 11.1 % — ABNORMAL HIGH (ref 0.0–8.0)
Lymphocytes Absolute: 2648 /uL (ref 850–3900)
Lymphocytes Relative: 33.1 % (ref 15.0–45.0)
Monocytes Absolute: 728 /uL (ref 200–950)
Monocytes Relative: 9.1 % (ref 0.0–12.0)
Neutrophils Absolute: 3696 /uL (ref 1500–7800)
Neutrophils Relative: 46.2 % (ref 40.0–80.0)

## 2014-11-10 LAB — SED RATE: Sed Rate: 5 mm/h (ref 0–30)

## 2014-11-10 MED ORDER — levocetirizine (XYZAL) 5 MG tablet
5 | ORAL_TABLET | Freq: Every morning | ORAL | Status: AC
Start: 2014-11-10 — End: 2014-11-24

## 2014-11-10 MED ORDER — cimetidine (TAGAMET) 800 MG tablet
800 | ORAL_TABLET | Freq: Two times a day (BID) | ORAL | Status: AC
Start: 2014-11-10 — End: 2014-11-24

## 2014-11-10 MED ORDER — doxepin (SINEQUAN) 25 MG capsule
25 | ORAL_CAPSULE | Freq: Every evening | ORAL | Status: AC
Start: 2014-11-10 — End: 2014-11-24

## 2014-11-10 NOTE — Unmapped (Signed)
Xyzal, sinequan, tagamet, check labs some concern for infection due to picking at lesions.

## 2014-11-10 NOTE — Unmapped (Signed)
Subjective  HPI:   Patient ID: Elaine [name] is a 59 y.o. female.    Chief Complaint:  HPI Comments: 2 weeks ago working in yard developed bumps placed on medrol, zyrtec 10 mg am, benadryle 50 mg hs.  no better trouble making fist with right hand knee is swollen and itchy. No recent illness. Switched from celebrex to mobic few weeks ago stopped mobic 2 weeks ago no better. Saw derm gave steroids didn't help made aggressive    Rash  Pertinent negatives include no fatigue.              Medications:  Current Outpatient Prescriptions   Medication Sig Dispense Refill   ??? acyclovir (ZOVIRAX) 400 MG tablet Take 1 tablet (400 mg total) by mouth daily. 30 tablet 5   ??? ALPRAZolam (XANAX) 0.25 MG tablet Take 1 tablet (0.25 mg total) by mouth at bedtime as needed. 30 tablet 3   ??? budesonide-formoterol (SYMBICORT) 80-4.5 mcg/actuation inhaler Inhale 2 puffs into the lungs 2 times a day. 1 Inhaler 3   ??? coenzyme Q10 100 mg capsule Take 100 mg by mouth daily. 30 capsule 0   ??? dextroamphetamine-amphetamine (AMPHETAMINE-DEXTROAMPHETAMINE) 20 mg Tab Take 1 tablet (20 mg total) by mouth 2 times a day. Fill after 08/08/13 60 tablet 0   ??? ferrous fumarate-b12-vitamic C-folic acid (FOLTRIN) 110-0.5 mg capsule Take 1 capsule by mouth at bedtime.     ??? FLUoxetine (PROZAC) 20 MG capsule Take 1 capsule (20 mg total) by mouth daily. 90 capsule 1   ??? UNABLE TO FIND Med Name: Testerone pellets       No current facility-administered medications for this visit.        ROS:   Review of Systems   Constitutional: Negative for fatigue.   Skin: Positive for rash.   All other systems reviewed and are negative.         Objective:   Physical Exam   Constitutional: She appears well-developed and well-nourished.   HENT:   Head: Normocephalic and atraumatic.   Cardiovascular: Normal rate, regular rhythm and normal heart sounds.  Exam reveals no gallop and no friction rub.    No murmur heard.  Pulmonary/Chest: Effort normal and breath sounds normal.    Musculoskeletal: Normal range of motion.   Skin:   Excoriated lesions b hands elbow, left knee b elbows   Psychiatric: She has a normal mood and affect. Her behavior is normal.             Filed Vitals:    11/10/14 1523   Pulse: 64   Resp: 12   Height: 5' 3 (1.6 m)   Weight: 162 lb (73.483 kg)     Body mass index is 28.7 kg/(m^2).  Body surface area is 1.81 meters squared.                Assessment/Plan:     Patient Active Problem List   Diagnosis   ??? Reactive airway disease   ??? Depression   ??? Restless legs   ??? Healthcare maintenance   ??? OA (osteoarthritis)   ??? Hyperlipidemia   ??? Chest pain   ??? Pelvic pain in female   ??? Dyspareunia   ??? Family history of ovarian cancer   ??? ADD (attention deficit disorder)   ??? HSV infection

## 2014-11-11 NOTE — Unmapped (Signed)
Pt will take the medication in evening instead because of how it makes her feel- she will not take again until tomorrow night

## 2014-11-11 NOTE — Unmapped (Signed)
Xyzal- your directions are to take in the morning and the pharmacy put a sticker on the bottle to take in the evening. Which is correct patient would like a call back?

## 2014-11-11 NOTE — Unmapped (Signed)
Please call with lab results

## 2014-11-12 NOTE — Unmapped (Signed)
Pt. Informed of message below

## 2014-11-12 NOTE — Unmapped (Signed)
Only abnormal is elevated eosinophils this is seen with allergic reactions live hives

## 2014-11-13 NOTE — Unmapped (Signed)
Pt can not make it today. What do you recommend?

## 2014-11-13 NOTE — Unmapped (Signed)
Come now

## 2014-11-13 NOTE — Unmapped (Signed)
Increase doxepin to 50 mg add medrol

## 2014-11-13 NOTE — Unmapped (Signed)
Front of neck is red- it hurts look like a red rash itchey painful . Started size of a quarter last night and it is spreading. Please call pt that is dr Rexene Edison

## 2014-11-13 NOTE — Unmapped (Signed)
Why sent back?

## 2014-11-14 NOTE — Unmapped (Signed)
Pt notified - She is going to hold off on this because it is doing much better today. She will call us Monday if she wants the medication

## 2014-11-24 ENCOUNTER — Ambulatory Visit: Admit: 2014-11-24 | Discharge: 2014-11-24 | Payer: PRIVATE HEALTH INSURANCE

## 2014-11-24 DIAGNOSIS — L509 Urticaria, unspecified: Secondary | ICD-10-CM

## 2014-11-24 MED ORDER — dextroamphetamine-amphetamine (AMPHETAMINE-DEXTROAMPHETAMINE) 20 mg Tab
20 | ORAL_TABLET | Freq: Two times a day (BID) | ORAL | Status: AC
Start: 2014-11-24 — End: 2014-11-24

## 2014-11-24 MED ORDER — dextroamphetamine-amphetamine (AMPHETAMINE-DEXTROAMPHETAMINE) 20 mg Tab
20 | ORAL_TABLET | Freq: Two times a day (BID) | ORAL | Status: AC
Start: 2014-11-24 — End: 2015-04-14

## 2014-11-24 MED ORDER — furosemide (LASIX) 20 MG tablet
20 | ORAL_TABLET | Freq: Every day | ORAL | Status: AC
Start: 2014-11-24 — End: 2014-12-20

## 2014-11-24 MED ORDER — cetirizine (ZYRTEC) 10 MG tablet
10 | ORAL_TABLET | Freq: Every day | ORAL | 2.00 refills | 30.00000 days | Status: AC
Start: 2014-11-24 — End: 2015-08-31

## 2014-11-24 MED ORDER — cimetidine (TAGAMET) 800 MG tablet
800 | ORAL_TABLET | Freq: Two times a day (BID) | ORAL | Status: AC
Start: 2014-11-24 — End: 2015-04-14

## 2014-11-24 NOTE — Unmapped (Signed)
Instructed pt to continue current meds

## 2014-11-24 NOTE — Unmapped (Signed)
Subjective  HPI:   Patient ID: Lakeisha Waldrop is a 59 y.o. female.    Chief Complaint:  HPI Comments: Feels out of it from sinequan xyzal causing dry mouth itching went away quickly after starting meds. jt pain better. Feels swollen. Feels like all started when switched to mobic             Medications:  Current Outpatient Prescriptions   Medication Sig Dispense Refill   ??? acyclovir (ZOVIRAX) 400 MG tablet Take 1 tablet (400 mg total) by mouth daily. 30 tablet 5   ??? ALPRAZolam (XANAX) 0.25 MG tablet Take 1 tablet (0.25 mg total) by mouth at bedtime as needed. 30 tablet 3   ??? celecoxib (CELEBREX) 200 MG capsule Take 200 mg by mouth 2 times a day.     ??? cimetidine (TAGAMET) 800 MG tablet Take 1 tablet (800 mg total) by mouth 2 times a day. 60 tablet 1   ??? dextroamphetamine-amphetamine (AMPHETAMINE-DEXTROAMPHETAMINE) 20 mg Tab Take 1 tablet (20 mg total) by mouth 2 times a day. Fill after 08/08/13 60 tablet 0   ??? doxepin (SINEQUAN) 25 MG capsule Take 1 capsule (25 mg total) by mouth at bedtime. 30 capsule 1   ??? FLUoxetine (PROZAC) 20 MG capsule Take 1 capsule (20 mg total) by mouth daily. 90 capsule 1   ??? UNABLE TO FIND Med Name: Testerone pellets     ??? coenzyme Q10 100 mg capsule Take 100 mg by mouth daily. 30 capsule 0     No current facility-administered medications for this visit.        ROS:   Review of Systems   Constitutional: Positive for fatigue.   All other systems reviewed and are negative.         Objective:   Physical Exam   Constitutional: She appears well-developed and well-nourished.   HENT:   Head: Normocephalic and atraumatic.   Cardiovascular: Normal rate, regular rhythm and normal heart sounds.  Exam reveals no gallop and no friction rub.    No murmur heard.  Pulmonary/Chest: Effort normal and breath sounds normal.   Abdominal: Soft. Bowel sounds are normal.   Musculoskeletal: Normal range of motion.   Skin:   Bilateral hand with fading rash   Psychiatric: She has a normal mood and affect. Her  behavior is normal.             Filed Vitals:    11/24/14 1240   BP: 96/66   Pulse: 68   Resp: 12     There is no weight on file to calculate BMI.  There is no height or weight on file to calculate BSA.                Assessment/Plan:     Patient Active Problem List   Diagnosis   ??? Reactive airway disease   ??? Depression   ??? Restless legs   ??? Healthcare maintenance   ??? OA (osteoarthritis)   ??? Hyperlipidemia   ??? Chest pain   ??? Pelvic pain in female   ??? Dyspareunia   ??? Family history of ovarian cancer   ??? ADD (attention deficit disorder)   ??? HSV infection   ??? Hives

## 2014-11-24 NOTE — Unmapped (Signed)
Lasix 20 mg prn

## 2014-11-24 NOTE — Unmapped (Signed)
D/c sinequan/xyzal start zyrtec cont tagamet f/u 3 mo

## 2014-12-16 MED ORDER — ALPRAZolam (XANAX) 0.25 MG tablet
0.25 | ORAL_TABLET | Freq: Every evening | ORAL | Status: AC | PRN
Start: 2014-12-16 — End: 2015-04-14

## 2014-12-16 NOTE — Unmapped (Signed)
[facility]  [place]    ALPRAZolam Prudy Feeler) 0.25 MG tablet

## 2014-12-23 MED ORDER — furosemide (LASIX) 20 MG tablet
20 | ORAL_TABLET | ORAL | Status: AC
Start: 2014-12-23 — End: 2015-01-28

## 2015-01-07 MED ORDER — FLUoxetine (PROZAC) 20 MG capsule
20 | ORAL_CAPSULE | ORAL | Status: AC
Start: 2015-01-07 — End: 2015-02-17

## 2015-01-07 NOTE — Unmapped (Signed)
Request from 6/11 is on your desk top

## 2015-01-07 NOTE — Unmapped (Signed)
Also, pt was expecting a refill for Xanax as well. It looks like it was approved May 24  but never received at the pharmacy.     [facility] [employer]

## 2015-01-07 NOTE — Unmapped (Signed)
PT is completely out of Prozac, the request was sent on 6/11. She is hoping this can be filled today.    [id number]

## 2015-01-07 NOTE — Unmapped (Signed)
appt nedded for xanax

## 2015-01-08 NOTE — Unmapped (Signed)
done

## 2015-01-08 NOTE — Unmapped (Signed)
Called med in

## 2015-01-08 NOTE — Unmapped (Signed)
Pt. Stated that the pharmacy never received the rx from 5/16

## 2015-01-28 MED ORDER — furosemide (LASIX) 20 MG tablet
20 | ORAL_TABLET | ORAL | Status: AC
Start: 2015-01-28 — End: 2015-04-14

## 2015-02-18 MED ORDER — FLUoxetine (PROZAC) 20 MG capsule
20 | ORAL_CAPSULE | ORAL | Status: AC
Start: 2015-02-18 — End: 2015-03-24

## 2015-03-19 MED ORDER — celecoxib (CELEBREX) 200 MG capsule
200 | ORAL_CAPSULE | ORAL | Status: AC
Start: 2015-03-19 — End: 2015-03-25

## 2015-03-24 NOTE — Unmapped (Signed)
Can you call in fluoxetine and celebrex to local pharm- pt did make a apt to see you

## 2015-03-25 MED ORDER — celecoxib (CELEBREX) 200 MG capsule
200 | ORAL_CAPSULE | Freq: Every day | ORAL | Status: AC
Start: 2015-03-25 — End: 2015-04-14

## 2015-03-25 MED ORDER — FLUoxetine (PROZAC) 20 MG capsule
20 | ORAL_CAPSULE | Freq: Every day | ORAL | Status: AC
Start: 2015-03-25 — End: 2015-04-14

## 2015-03-26 MED ORDER — celecoxib (CELEBREX) 200 MG capsule
200 | ORAL_CAPSULE | ORAL | Status: AC
Start: 2015-03-26 — End: 2015-04-14

## 2015-04-14 ENCOUNTER — Ambulatory Visit: Admit: 2015-04-14 | Discharge: 2015-04-14 | Payer: PRIVATE HEALTH INSURANCE

## 2015-04-14 DIAGNOSIS — F32A Depression, unspecified: Secondary | ICD-10-CM

## 2015-04-14 MED ORDER — acyclovir (ZOVIRAX) 400 MG tablet
400 | ORAL_TABLET | Freq: Every day | ORAL | Status: AC
Start: 2015-04-14 — End: 2015-04-15

## 2015-04-14 MED ORDER — dextroamphetamine-amphetamine (ADDERALL) 20 mg Tab
20 | ORAL_TABLET | Freq: Two times a day (BID) | ORAL | Status: AC
Start: 2015-04-14 — End: 2015-04-14

## 2015-04-14 MED ORDER — dextroamphetamine-amphetamine (ADDERALL) 20 mg Tab
20 | ORAL_TABLET | Freq: Two times a day (BID) | ORAL | Status: AC
Start: 2015-04-14 — End: 2015-08-31

## 2015-04-14 MED ORDER — celecoxib (CELEBREX) 200 MG capsule
200 | ORAL_CAPSULE | Freq: Every day | ORAL | Status: AC
Start: 2015-04-14 — End: 2015-08-31

## 2015-04-14 MED ORDER — dexlansoprazole (DEXILANT) 60 mg capsule
60 | ORAL_CAPSULE | Freq: Every day | ORAL | Status: AC
Start: 2015-04-14 — End: 2015-08-31

## 2015-04-14 MED ORDER — ALPRAZolam (XANAX) 0.25 MG tablet
0.25 | ORAL_TABLET | Freq: Every evening | ORAL | Status: AC | PRN
Start: 2015-04-14 — End: 2015-08-31

## 2015-04-14 MED ORDER — FLUoxetine (PROZAC) 20 MG capsule
20 | ORAL_CAPSULE | Freq: Every day | ORAL | Status: AC
Start: 2015-04-14 — End: 2015-08-13

## 2015-04-15 MED ORDER — acyclovir (ZOVIRAX) 400 MG tablet
400 | ORAL_TABLET | ORAL | Status: AC
Start: 2015-04-15 — End: 2015-08-31

## 2015-04-19 NOTE — Unmapped (Signed)
acyclovir

## 2015-04-19 NOTE — Unmapped (Signed)
Subjective  HPI:   Patient ID: Elaine [name] is a 59 y.o. female.    Chief Complaint:  HPI Comments: F/u depression, gerd, add oa             Medications:  Current Outpatient Prescriptions   Medication Sig Dispense Refill   ??? ALPRAZolam (XANAX) 0.25 MG tablet Take 1 tablet (0.25 mg total) by mouth at bedtime as needed. 30 tablet 0   ??? celecoxib (CELEBREX) 200 MG capsule Take 1 capsule (200 mg total) by mouth daily. 30 capsule 3   ??? celecoxib (CELEBREX) 200 MG capsule Take 1 capsule (200 mg total) by mouth daily. 30 capsule 3   ??? dextroamphetamine-amphetamine (ADDERALL) 20 mg Tab Take 1 tablet (20 mg total) by mouth 2 times a day. 60 tablet 0   ??? FLUoxetine (PROZAC) 20 MG capsule Take 1 capsule (20 mg total) by mouth daily. 30 capsule 3   ??? UNABLE TO FIND Med Name: Testerone pellets     ??? acyclovir (ZOVIRAX) 400 MG tablet TAKE ONE TABLET BY MOUTH DAILY 30 tablet 3   ??? cetirizine (ZYRTEC) 10 MG tablet Take 1 tablet (10 mg total) by mouth daily. 30 tablet 0   ??? coenzyme Q10 100 mg capsule Take 100 mg by mouth daily. 30 capsule 0   ??? dexlansoprazole (DEXILANT) 60 mg capsule Take 1 capsule (60 mg total) by mouth daily. 30 capsule 0     No current facility-administered medications for this visit.        ROS:   Review of Systems   Constitutional: Negative for fatigue.   All other systems reviewed and are negative.         Objective:   Physical Exam   Constitutional: She appears well-developed and well-nourished.   HENT:   Head: Normocephalic and atraumatic.   Cardiovascular: Normal rate, regular rhythm and normal heart sounds.  Exam reveals no gallop and no friction rub.    No murmur heard.  Pulmonary/Chest: Effort normal and breath sounds normal.   Abdominal: Soft. Bowel sounds are normal.   Musculoskeletal: Normal range of motion.   Psychiatric: She has a normal mood and affect. Her behavior is normal.             Filed Vitals:    04/14/15 0830   BP: 108/64   Pulse: 69   Resp: 11   Height: 5' 3 (1.6 m)   Weight:  160 lb (72.576 kg)     Body mass index is 28.35 kg/(m^2).  Body surface area is 1.80 meters squared.                Assessment/Plan:     Patient Active Problem List   Diagnosis   ??? Reactive airway disease   ??? Depression   ??? Restless legs   ??? Healthcare maintenance   ??? OA (osteoarthritis)   ??? Hyperlipidemia   ??? Family history of ovarian cancer   ??? ADD (attention deficit disorder)   ??? HSV infection   ??? Hives   ??? Edema

## 2015-04-19 NOTE — Unmapped (Signed)
Instructed pt to continue current meds

## 2015-08-11 NOTE — Unmapped (Signed)
Pt is getting new insurance on feb 1- can you call in Prozac - and xanax the generic to local pharm- - pt made a med check in feb to see you-

## 2015-08-13 MED ORDER — FLUoxetine (PROZAC) 20 MG capsule
20 | ORAL_CAPSULE | Freq: Every day | ORAL | Status: AC
Start: 2015-08-13 — End: 2015-08-31

## 2015-08-13 NOTE — Unmapped (Signed)
Pt is scheduled with you Feb 6. Can you refill Prozac.    [facility] [employer]

## 2015-08-31 ENCOUNTER — Ambulatory Visit: Admit: 2015-08-31 | Discharge: 2015-08-31 | Payer: PRIVATE HEALTH INSURANCE

## 2015-08-31 DIAGNOSIS — Z0000 Encounter for general adult medical examination without abnormal findings: Secondary | ICD-10-CM

## 2015-08-31 MED ORDER — FLUoxetine (PROZAC) 20 MG capsule
20 | ORAL_CAPSULE | Freq: Every day | ORAL | Status: AC
Start: 2015-08-31 — End: 2016-01-27

## 2015-08-31 MED ORDER — acyclovir (ZOVIRAX) 400 MG tablet
400 | ORAL_TABLET | Freq: Every day | ORAL | Status: AC
Start: 2015-08-31 — End: ?

## 2015-08-31 MED ORDER — dextroamphetamine-amphetamine (ADDERALL) 20 mg Tab
20 | ORAL_TABLET | Freq: Two times a day (BID) | ORAL | Status: AC
Start: 2015-08-31 — End: 2015-08-31

## 2015-08-31 MED ORDER — dextroamphetamine-amphetamine (ADDERALL) 20 mg Tab
20 | ORAL_TABLET | Freq: Two times a day (BID) | ORAL | Status: AC
Start: 2015-08-31 — End: ?

## 2015-08-31 MED ORDER — celecoxib (CELEBREX) 200 MG capsule
200 | ORAL_CAPSULE | Freq: Every day | ORAL | Status: AC
Start: 2015-08-31 — End: ?

## 2015-08-31 MED ORDER — ALPRAZolam (XANAX) 0.25 MG tablet
0.25 | ORAL_TABLET | Freq: Every evening | ORAL | Status: AC | PRN
Start: 2015-08-31 — End: 2016-02-03

## 2015-09-01 NOTE — Unmapped (Signed)
Instructed pt to continue current meds

## 2015-09-01 NOTE — Unmapped (Signed)
Subjective  HPI:   Patient ID: Elaine [name] is a 60 y.o. female.    Chief Complaint:  HPI Comments: F/u depression add oa gerd    Depression  Pertinent negatives include no fatigue.              Medications:  Current Outpatient Prescriptions   Medication Sig Dispense Refill   ??? dextroamphetamine-amphetamine (ADDERALL) 20 mg Tab Take 1 tablet (20 mg total) by mouth 2 times a day. 60 tablet 0   ??? FLUoxetine (PROZAC) 20 MG capsule Take 1 capsule (20 mg total) by mouth daily. 30 capsule 3   ??? lansoprazole (PREVACID) 15 MG capsule Take 15 mg by mouth every morning before breakfast.     ??? acyclovir (ZOVIRAX) 400 MG tablet Take 1 tablet (400 mg total) by mouth daily. 30 tablet 3   ??? ALPRAZolam (XANAX) 0.25 MG tablet Take 1 tablet (0.25 mg total) by mouth at bedtime as needed. 30 tablet 2   ??? celecoxib (CELEBREX) 200 MG capsule Take 1 capsule (200 mg total) by mouth daily. 30 capsule 3   ??? coenzyme Q10 100 mg capsule Take 100 mg by mouth daily. 30 capsule 0     No current facility-administered medications for this visit.        ROS:   Review of Systems   Constitutional: Negative for fatigue.   Psychiatric/Behavioral: Positive for depression.   All other systems reviewed and are negative.         Objective:   Physical Exam   Constitutional: She appears well-developed and well-nourished.   HENT:   Head: Normocephalic and atraumatic.   Cardiovascular: Normal rate, regular rhythm and normal heart sounds.  Exam reveals no gallop and no friction rub.    No murmur heard.  Pulmonary/Chest: Effort normal and breath sounds normal.   Abdominal: Soft. Bowel sounds are normal.   Musculoskeletal: Normal range of motion.   Psychiatric: She has a normal mood and affect. Her behavior is normal.             Filed Vitals:    08/31/15 1333   BP: 100/78   Pulse: 61   Resp: 15   Height: 5' 4 (1.626 m)   Weight: 160 lb (72.576 kg)   SpO2: 98%     Body mass index is 27.45 kg/(m^2).  Body surface area is 1.81 meters squared.                 Assessment/Plan:     Patient Active Problem List   Diagnosis   ??? Reactive airway disease   ??? Depression   ??? Restless legs   ??? Healthcare maintenance   ??? OA (osteoarthritis)   ??? Hyperlipidemia   ??? ADD (attention deficit disorder)   ??? HSV infection

## 2015-09-01 NOTE — Unmapped (Signed)
celebrex

## 2016-01-01 NOTE — Unmapped (Signed)
Left message for patient to call back to schedule for her annual lung screen. Patient will have to get her insurance updated.

## 2016-01-27 MED ORDER — FLUoxetine (PROZAC) 20 MG capsule
20 | ORAL_CAPSULE | ORAL | Status: AC
Start: 2016-01-27 — End: 2016-06-27

## 2016-02-03 MED ORDER — ALPRAZolam (XANAX) 0.25 MG tablet
0.25 | ORAL_TABLET | Freq: Every evening | ORAL | Status: AC | PRN
Start: 2016-02-03 — End: ?

## 2016-02-03 NOTE — Unmapped (Signed)
Pt due for refill.  Pt has appt scheduled  OARRS are up to date

## 2016-03-07 ENCOUNTER — Encounter

## 2016-06-23 NOTE — Telephone Encounter (Addendum)
Pt needs fasting lab order for physical. Pt slowing took her self off of prozac do to insurance -made a appt for a physical and ask can you call in prozac until jan- her insurance will not cover a med refill she stated.

## 2016-06-23 NOTE — Telephone Encounter (Signed)
ERROR

## 2016-06-27 MED ORDER — FLUoxetine (PROZAC) 20 MG capsule
20 | ORAL_CAPSULE | Freq: Every day | ORAL | 0 refills | Status: AC
Start: 2016-06-27 — End: ?

## 2016-06-27 NOTE — Telephone Encounter (Signed)
Left message informing pt., lab orders added and refill sent to the pharmacy

## 2016-09-22 ENCOUNTER — Inpatient Hospital Stay: Admit: 2016-09-22 | Payer: PRIVATE HEALTH INSURANCE

## 2016-09-22 DIAGNOSIS — Z1231 Encounter for screening mammogram for malignant neoplasm of breast: Secondary | ICD-10-CM

## 2016-10-18 NOTE — Telephone Encounter (Signed)
Tomorrow end

## 2016-10-18 NOTE — Telephone Encounter (Signed)
Pt. Feels she has a sinus infection and can't get in to see her ENT till April 9th  They referred her to get in with you sooner.

## 2016-10-18 NOTE — Telephone Encounter (Signed)
LMTCB

## 2016-10-19 NOTE — Telephone Encounter (Signed)
LMOM for PTCB X 2

## 2016-10-20 NOTE — Telephone Encounter (Signed)
LMTCBx3.

## 2016-12-08 NOTE — Unmapped (Addendum)
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 She has had rhinitis and a septal button in the past and now needs it replaced. She denies any chest pain, dyspnea, fever, chills or   illness. Her past medical history was reviewed and is listed below.      Past Medical History: Diagnosis Date   ADD (attention deficit disorder with hyperactivity)   Arthritis   Depression   Depressive disorder, not elsewhere classified   History of stomach ulcers      Past Surgical History: Procedure Laterality Date   ANTERIOR CERVICAL CORPECTOMY W/ FUSION  with hardware   ANTERIOR CRUCIATE LIGAMENT REPAIR Left   CARPAL TUNNEL RELEASE Right   CESAREAN SECTION, CLASSIC  x 3   CHOLECYSTECTOMY   CHOLECYSTECTOMY   WITH/WITHOUT COMMON DUCT EXPLORATION   COLONOSCOPY  06/10/2011  5 yr repeat colon polyps   ESOPHAGO GASTRO DUODENOSCOPY WITH ENDOSCOPIC ULTRASOUND N/A 06/26/2012  Procedure: ESOPHAGO GASTRO DUODENOSCOPY WITH ENDOSCOPIC ULTRASOUND;  Surgeon: Gwenlyn Fudge., MD;  Location: [facility];  Service: Gastroenterology; Laterality: N/A;   HERNIA REPAIR Bilateral  inguinal   RHINOPLASTY   TUBAL LIGATION      Family History Problem Relation Age of Onset   Cancer Mother   Other (Other) Mother   Cancer Father   Other (Other) Father   Diabetes Maternal Grandmother   Other (Other) Maternal Grandmother   Cancer Maternal Grandfather   Other (Other) Maternal    Grandfather      Social History      Social History   Marital status: Married   Spouse name: N/A   Number of children: N/A   Years of education: N/A      Occupational History   Not on file.      Social History Main Topics   Smoking status: Former Smoker   Packs/day: 1.00   Years: 20.00   Quit date: 11/18/2010   Smokeless tobacco: Never Used   Alcohol use No    Comment: Quit drinking over one year ago   Drug use: No   Sexual activity: Yes      Other Topics Concern   Appropriate Use Of Safety Belts In Car? Yes   Do You Perform A Monthly Self Breast Exam? No   Do You Take Any Type Medication For Osteoporosis? No   Do You Exercise? No   Have You Been Given Advanced Directives? Yes      Social History Narrative   No narrative on file      History of adverse reaction to anesthesia? Yes-PONV      Patient/Family history of malignant hyperthermia or pseudocholinesterase deficiency? No      Home Medications: Prior to Admission medications Medication Sig Start Date End Date Taking? Authorizing Provider cefadroxil (DURICEF) 500 MG CAPS Take 1 capsule by mouth every 12 (twelve) hours. 12/08/16  Yes Isaacs,  Emmie Niemann, MD mupirocin Idelle Jo)   2 % OINT apply small amt to each nostril bid x 1 month 10/31/16 12/07/16 Yes Isaacs, Emmie Niemann, MD amphetamine-dextroamphetamine (ADDERALL XR) 20 MG CP24 Take 20 mg by mouth every morning.   Yes HISTORICAL MED cefadroxil (DURICEF) 500 MG CAPS Take 1   capsule by mouth every 12 (twelve) hours. 05/23/13 12/07/16  Franklyn Lor, MD ALPRAZolam Prudy Feeler) 0.5 MG TABS Take 0.5 mg by mouth at bedtime as needed. 12/07/16  HISTORICAL MED Metoclopramide HCl (REGLAN PO) Take  by mouth.  12/07/16  HISTORICAL MED   celecoxib (CELEBREX) 100 MG CAPS Take 100 mg by mouth daily.  12/07/16 HISTORICAL MED Dexlansoprazole 60 MG CPDR Take 60 mg by mouth daily. am  12/07/16  HISTORICAL MED fluoxetine (PROZAC) 40 MG CAPS Take 20 mg by mouth daily. am  12/07/16 HISTORICAL MED      Labs/ECG      Recent Results (from  the past 72 hour(s)) ECG 12 lead  Collection Time: 12/08/16  1:01 PM Result Value Ref Range  Height  in  HEART RATE 56 bpm  RR INTERVAL 1,071 ms  ATRIAL RATE 56 ms  P-R Interval 142 ms  P Duration 83 ms  P HORIZONTAL 25 deg  P FRONT   AXIS 10 deg  Q Onset 507 ms  QRSD Interval 87 ms  QT INTERVAL 447 ms  QTCB 432 ms  QTcF 437 ms  QRS Horizontal Axis -49 deg  QRS AXIS 29 deg  I-40 Horizontal Axis -2 deg  I-40 FRONT AXIS 20 deg  T-40 Horizontal Axis -82 deg  T-40 Front Axis 62 deg  T   Horizontal Axis 30 deg  T WAVE AXIS 27 deg  S-T Horizontal Axis 41 deg  S-T Front Axis 40 deg  ECG Severity - BORDERLINE ECG -  ECG Severity SR-Sinus rhythm-normal P axis, V-rate 50-99  ECG Severity   LVOLT-Low voltage, extremity and precordial   leads-extremity<0.103mV, precordial<1.72mV  ECGGUID [id number]           Review of Systems: Constitutional:WNL HEENT: see HPI Breast: WNL Respiratory: resolved sinus infection 6 weeks ago, using Cardiovascular: has low BP as her baseline, especially after anesthesia Gastrointestinal: stonach ulcers, not bleeding   Genitourinary: WNL Integumentary: WNL Hematologic/Lymphatic: WNL Musculoskeletal: arthralgias Neurological: WNL Endocrine: WNL Psychiatric/Behavioral: depression and off and on Prozac for the last 2 years      Objective:      BP 91/58    Pulse 57    Temp 97.7  F (36.5  C) (Oral)    Resp 16    Ht 64 (162.6 cm)    Wt 151 lb (68.5 kg)    SpO2 99% on room air    Breastfeeding? No   BMI 25.92 kg/m2      Physical Exam: Constitutional: no fever, no chills, no significant weight loss and alert, calm, cooperative well appearing female in NAD, no c/o pain at this time HEENT: Normocephalic, without obvious abnormality, atraumatic and supple, no nodes in the   neck or supraclavicular regions and normal thyroid Heart: Regular and no murmur Breast: exam deferred Lungs: Respirations unlabored and clear to auscultation Abdomen: Abdomen soft, non-tender. BS normal. No masses,  organomegaly Pelvic and Genitalia: exam   deferred Extremities: peripheal pulses detected, no peripheal edma , Extremities normal. No deformities, edema, or skin discoloration. Integumentary: no swelling, no redness, no warmth, no drainage and no rashes Neurological: alert, oriented to time,   place, person and situation Psychological: calm,  mood appropriate for situation      Functional Capacity: 7 - 9 Mets      Plan of care: The patient is stable for surgery today. No labs to review. ECG reviewed. Anesthesia MD will evaluate patient preoperatively. The patient has no further questions at this time.           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           Electronically Signed by: Ramon Dredge, CNP, 12/08/2016 12:54 PM                     _________________________________  Signed by:   Gardiner Barefoot NICOLE NEIS     11    D: 12/08/2016 11:20 AM  T: 12/08/2016 11:20 AM    This document is confidential medical information.  Unauthorized disclosure or use of this information is prohibited by law.  If you are not the intended recipient of this document, please advise [place] by calling immediately [phone].

## 2016-12-08 NOTE — Unmapped (Signed)
Operative Report      Patient: Elaine [name] MRN: [id number]  Billing Number: [id number] Date of Birth: 1955/08/15  Age: 61 year old  Sex: female      Date of Procedure: 12/08/2016      Pre Operative Diagnoses: Septal perforation      Post Operative Diagnoses:  Same      Procedure: Septal button placement      Surgeon: Daylene Posey, MD Assistant: none Anesthesia: Gen. endotracheal anesthesia      Findings: 1.5 caudalseptal perforation with clean margins      Indications: 61 year old female with long history of septal perforation.  She has had a septal button before.  Lately she had a lot of crusting and obvious biofilm formation along both sides septal button.  She now presents for replacement of the septal   button.      Description:  The patient was verified and transported to the operating room. Her care was turned over to the anesthesiologist who initiated general endotracheal anesthesia without difficulty.  Preprocedure timeout was performed. The bed was kept in the   neutral position.  She was prepped and draped in usual sterile fashion. The midportion of the old septal button was cut with scissors.  It was removed out through the nose.  There was some bleeding along the posterior margin of the perforation which was   controlled with Afrin pledgets.  A 1 piece 3 cm septal button was placed without difficulty.  This seemed to fit the nasal cavities nicely with adequate coverage of the perforation.  The surface of each side of the septal bone was coated with Polysporin   ointment.    A final time out was performed at the end of the procedure and the counts were correct.  The entire procedure was performed by Dr Erma Heritage Estimated Blood Loss:  Minimal      Complications:  none                     _________________________________  Signed by:   Franklyn Lor   SETH JOSEPH ISAACS     IS    D: 12/08/2016 02:32 PM  T: 12/08/2016 02:32 PM    This document is confidential medical information.   Unauthorized disclosure or use of this information is prohibited by law.  If you are not the intended recipient of this document, please advise [place] by calling immediately [phone].

## 2016-12-08 NOTE — Unmapped (Signed)
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 There have been no significant clinical changes since the completion of the above History and Physical.      Patient Ready for OR: Yes      Signed By: Franklyn Lor, MD  Dec 08, 2016 12:14 PM      .           _________________________________  Signed by:   Emmie Niemann ISAACS   SETH JOSEPH ISAACS     IS    D: 12/08/2016 12:14 PM  T: 12/08/2016 12:14 PM    This document is confidential medical information.  Unauthorized disclosure or use of this information is prohibited by law.  If you are not the intended recipient of this document, please advise [place] by calling immediately [phone].

## 2016-12-22 MED ORDER — acyclovir (ZOVIRAX) 400 MG tablet
400 | ORAL_TABLET | ORAL | 2 refills | Status: AC
Start: 2016-12-22 — End: ?

## 2017-08-28 NOTE — Unmapped (Signed)
I think those are just things I may have treated her for in the past not chronic diagnoses, im not sure what she wants me to do? I can change past records but if she wanted to come in and I could document that all is resolved?

## 2017-08-28 NOTE — Unmapped (Signed)
Pt is max rated or turned down for health insurance.  Pt denies Restless leg, Reactive airway disease, high cholesterol, Osteoarthritis or depression (depression was situational during a divorce 20 yrs ago, off and on since then due to situations)  Some medications were prescribed but did not fill them. She only took Prozac years ago.   Pt only mentioned some symptoms but was not aware that these would be considered a diagnosis.  Pt would like you to write a letter stating that pt is not being treated for any of these things within the last yr.

## 2017-08-28 NOTE — Telephone Encounter (Signed)
Pt not seen since 08/31/15.  Please advise.

## 2017-08-29 NOTE — Unmapped (Signed)
LMTCB

## 2017-08-29 NOTE — Telephone Encounter (Signed)
See below she can schedule to be seen and Dr. Rico Junker may be able to put resolved

## 2017-08-30 NOTE — Telephone Encounter (Signed)
LMTCB X 2

## 2017-08-31 NOTE — Telephone Encounter (Signed)
LMTCB x 3  Sending letter

## 2017-11-27 NOTE — Telephone Encounter (Signed)
Pt advised

## 2017-11-27 NOTE — Telephone Encounter (Signed)
Lmom - dr h stated she needs a appt but we do not take her insurance

## 2017-11-27 NOTE — Telephone Encounter (Signed)
I have no seen in 2 years would need appt

## 2017-11-27 NOTE — Telephone Encounter (Signed)
We do not take patient insurance ambetter- pt is asking can you call in one month of prozac to her local pharm.

## 2018-01-31 NOTE — Progress Notes (Signed)
Historical chart review.

## 2020-11-02 IMAGING — MG MAMMOGRAPHY SCREENING BILATERAL 3D TOMOSYNTHESIS WITH CAD
7 series · 8 of 23 positions shown · non-contrast
Comparison: No prior examinations were available for comparison.

MAMMOGRAPHY SCREENING BILATERAL 3D TOMOSYNTHESIS WITH CAD, 11/02/2020 [DATE]: 
CLINICAL INDICATION: Screening exam.
TECHNIQUE: Digital bilateral mammograms and 3-D Tomosynthesis were obtained. 
These were interpreted both primarily and with the aid of computer-aided 
detection system. 
BREAST DENSITY: (Level C) The breasts are heterogeneously dense, which may 
obscure small masses.

[L MLO]
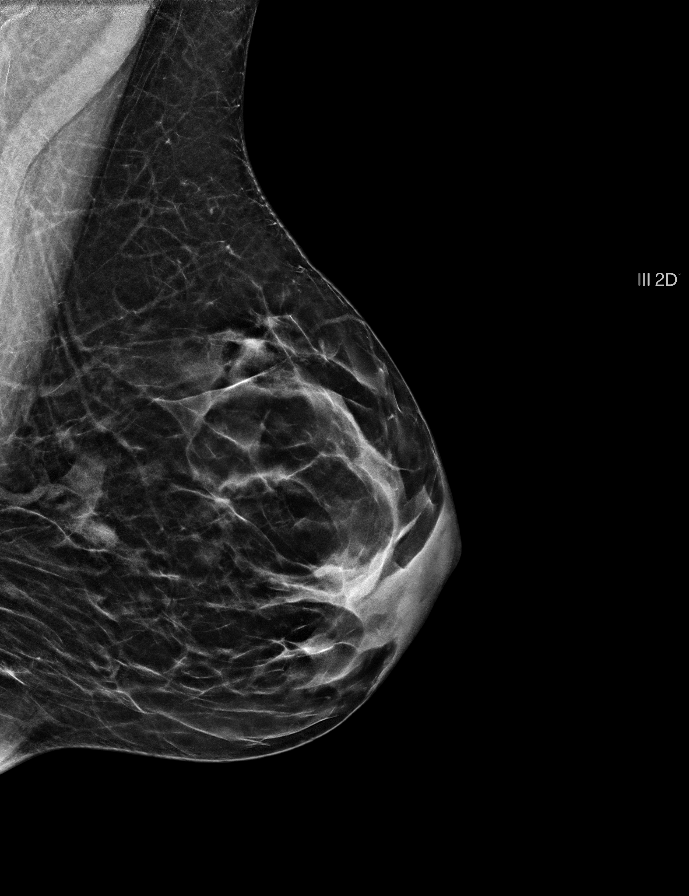

[R CC]
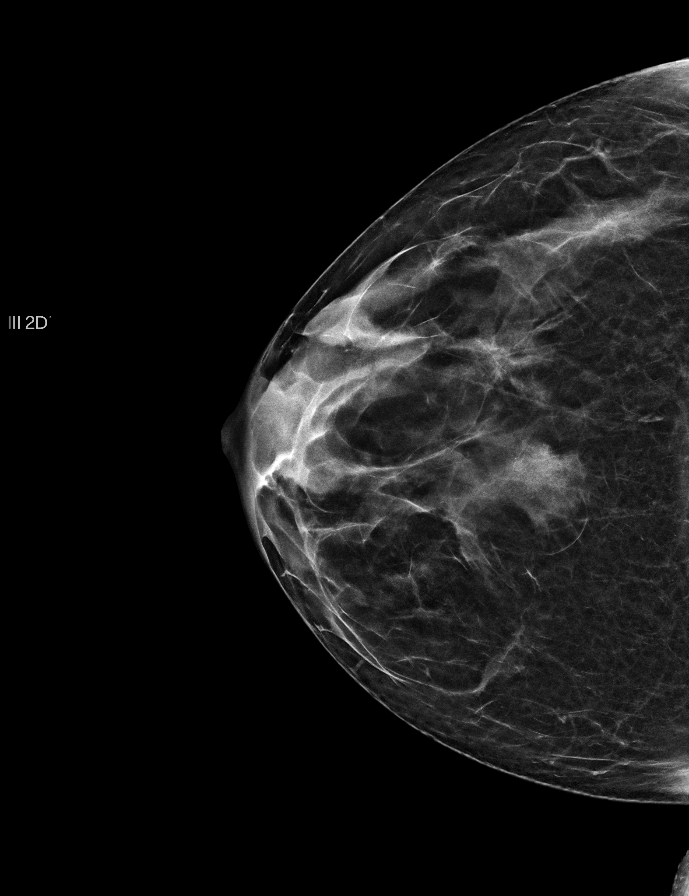

[L CC]
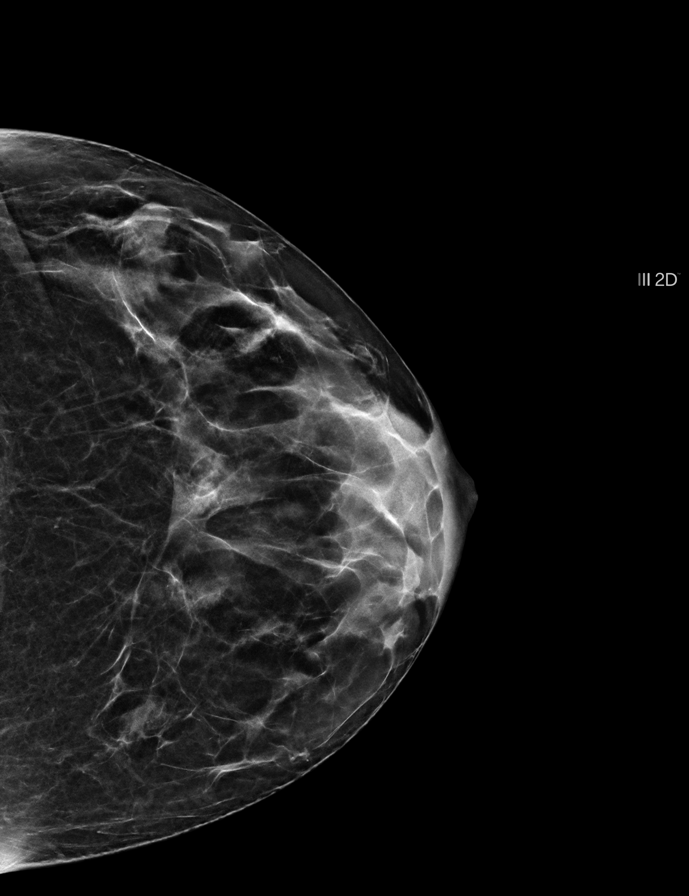

[R MLO tomo · 2 of 49 frames shown]
[frame 16/49]
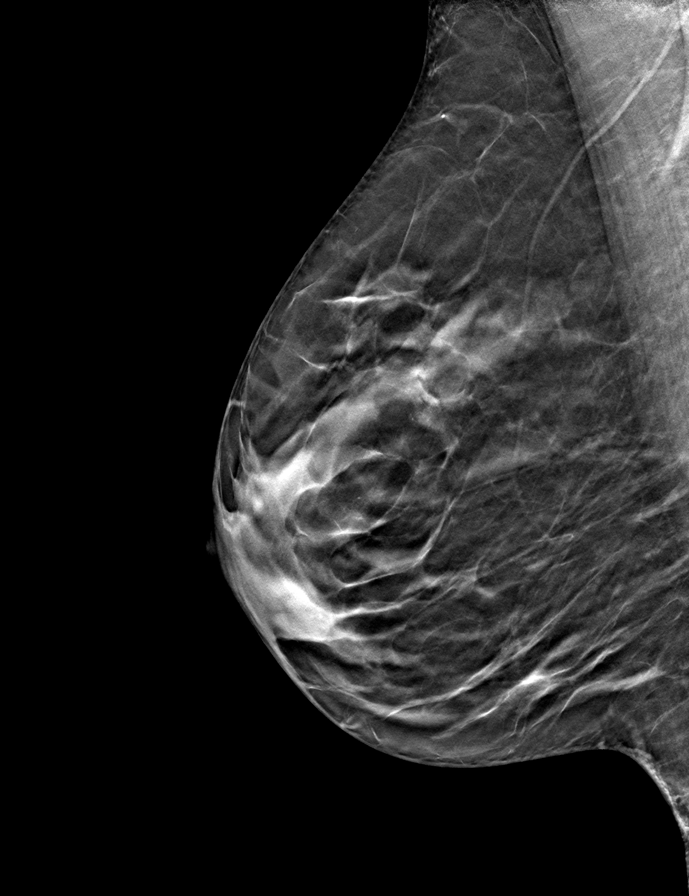
[frame 25/49]
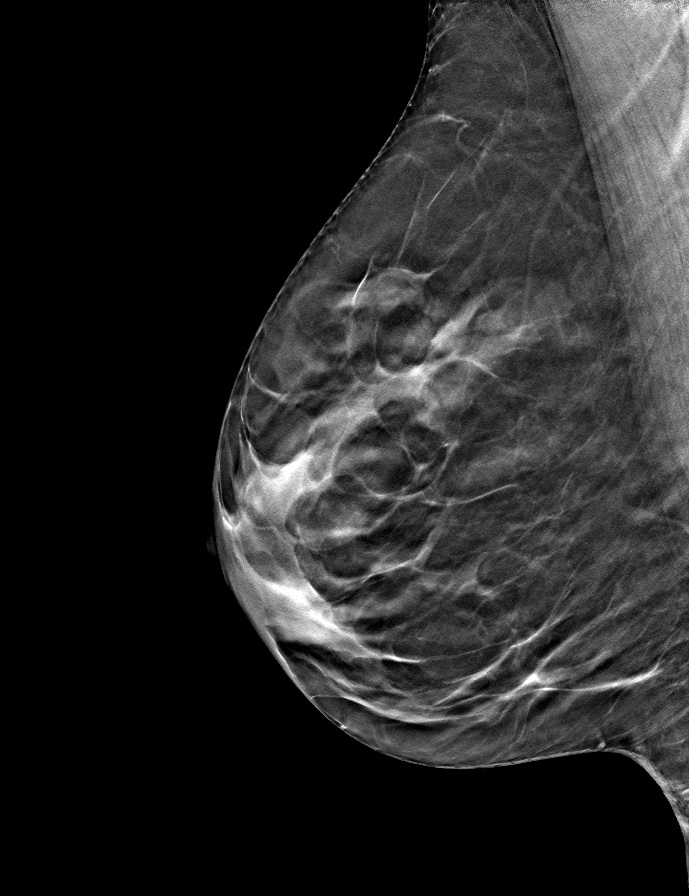

[L CC tomo · tomo slice 23/44.0]
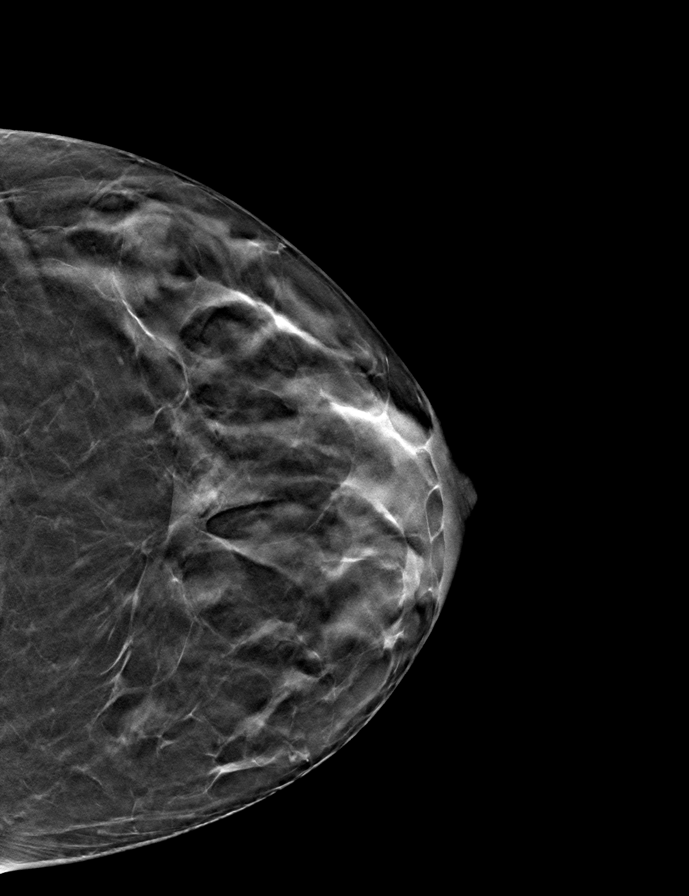

[L MLO tomo · tomo slice 25/49.0]
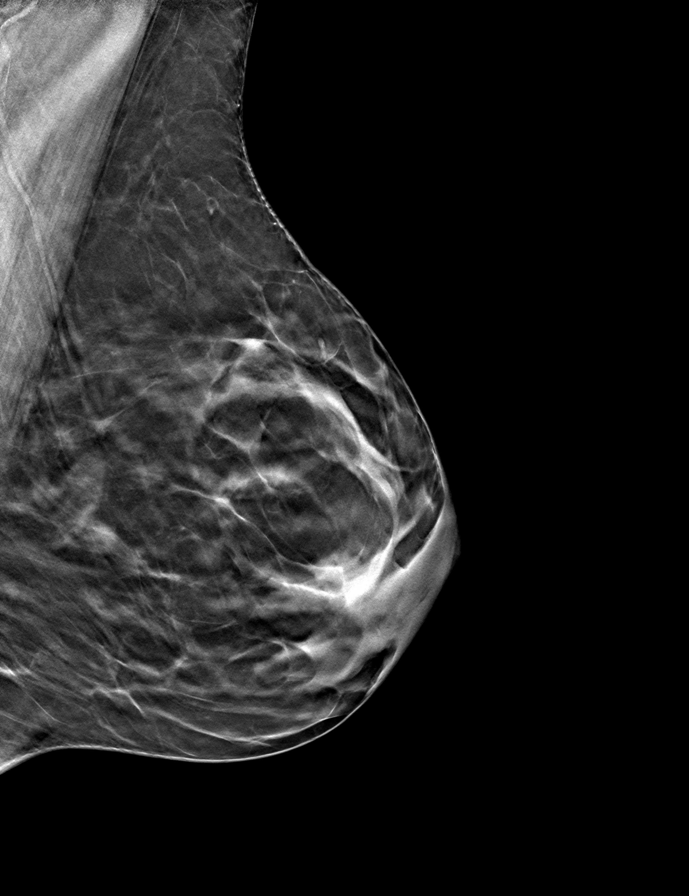

[R CC tomo · tomo slice 25/49.0]
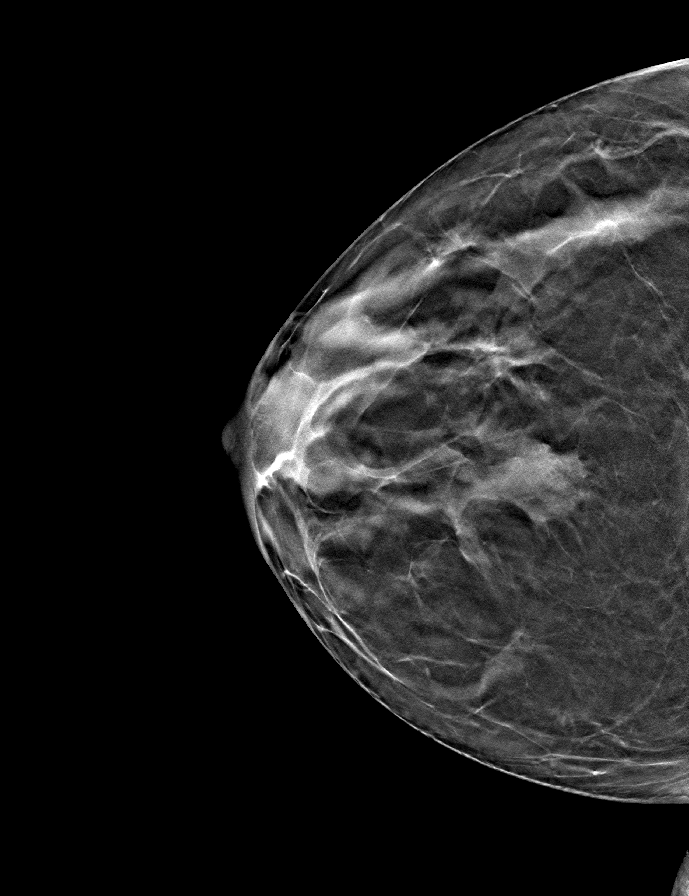

[8 of 23 positions shown; findings below may reference images not displayed]

Please note 
comparison with prior examinations increases the sensitivity for the detection 
of malignancy and decreases the false positive recall rate. If prior 
examinations are made available an addendum to this report will be generated.
FINDINGS: Heterogeneously dense breast parenchyma is present. No suspicious mass, 
calcifications, or area of architectural distortion in either breast.
IMPRESSION: 1.  No mammographic evidence of malignancy in either breast. 
2.  No prior examinations were available for comparison. Please note comparison 
with prior examinations increases the sensitivity for the detection of 
malignancy and decreases the false positive recall rate. If prior examinations 
are made available an addendum to this report will be generated. 
( BI-RADS 1) Negative mammogram. Routine mammographic follow-up is recommended.

## 2021-05-03 IMAGING — DX CHEST PA AND LATERAL
1 series · 2 of 2 positions shown · non-contrast
Comparison: None.

________________________________________________________________________________________________ 
CLINICAL INDICATION: Dry nonproductive cough, patient has been on 2 different 
rounds of medication, now on an inhaler

[Series 1: PA · U · 0.14mm/px · 2 of 2 slices shown]
[im 1/2]
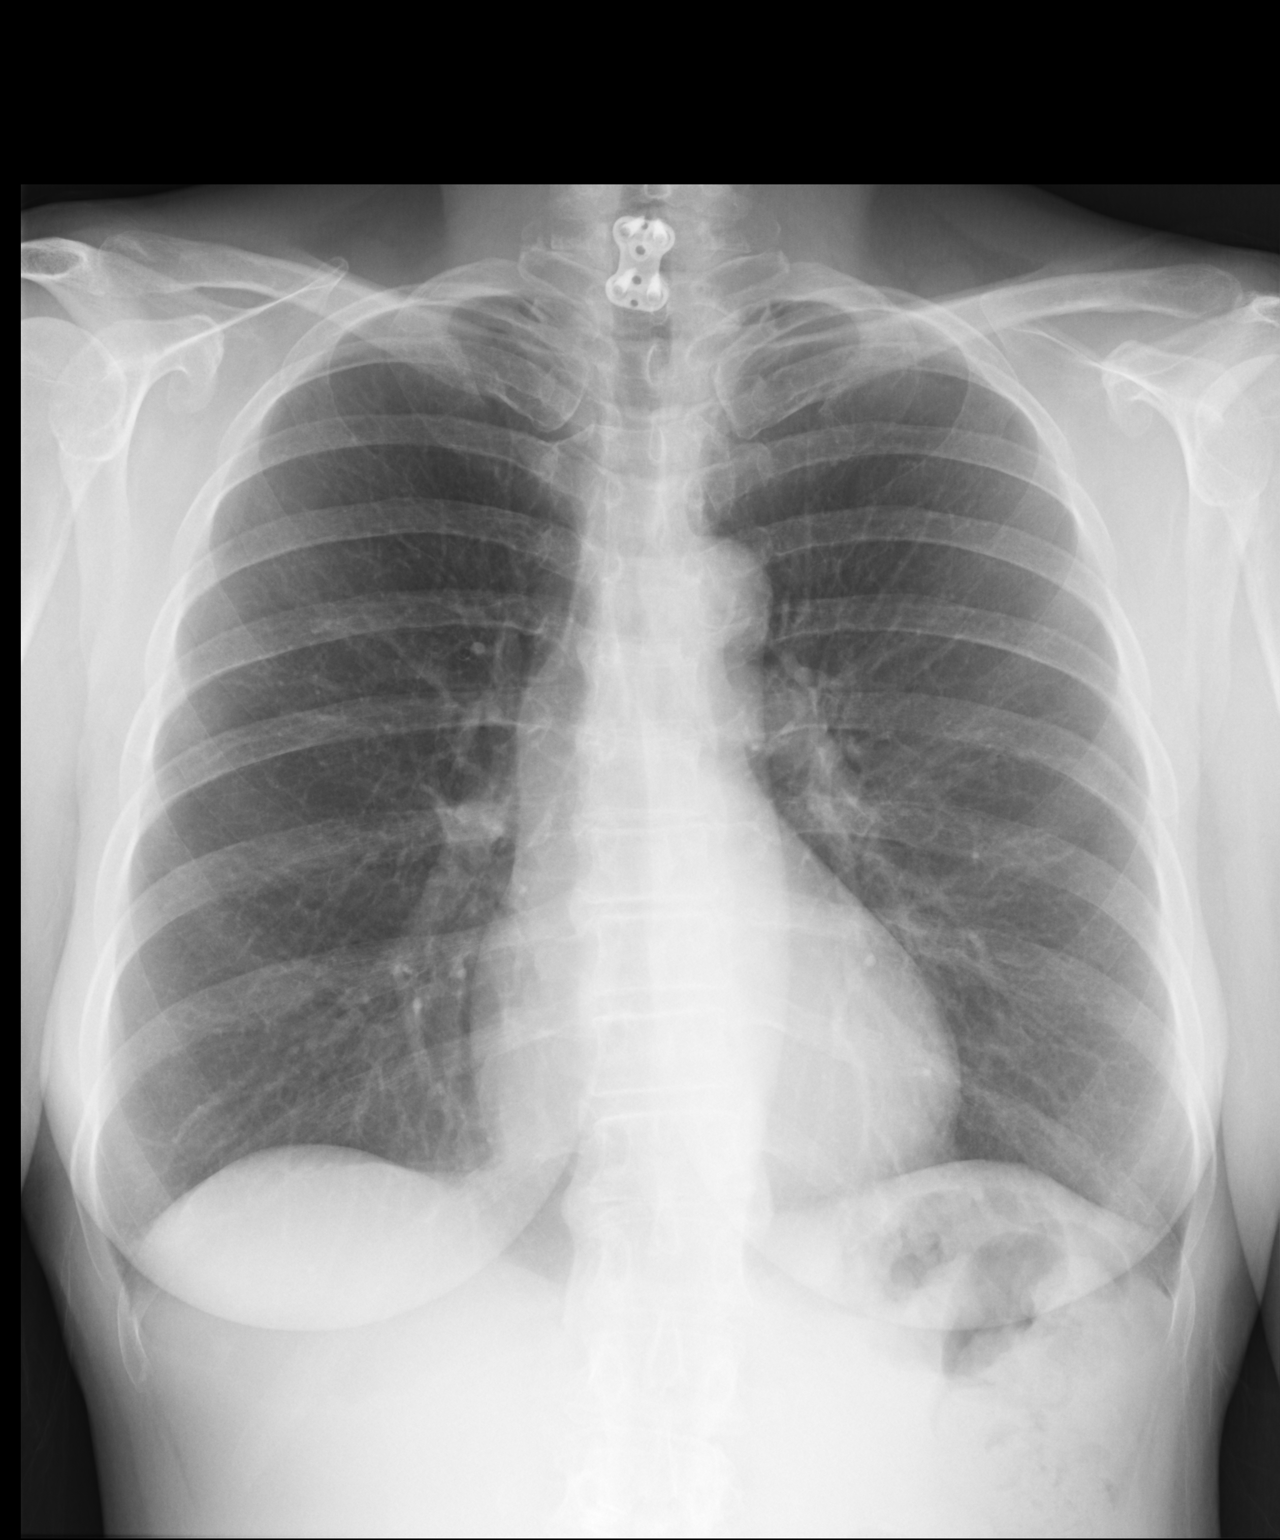
[im 2/2]
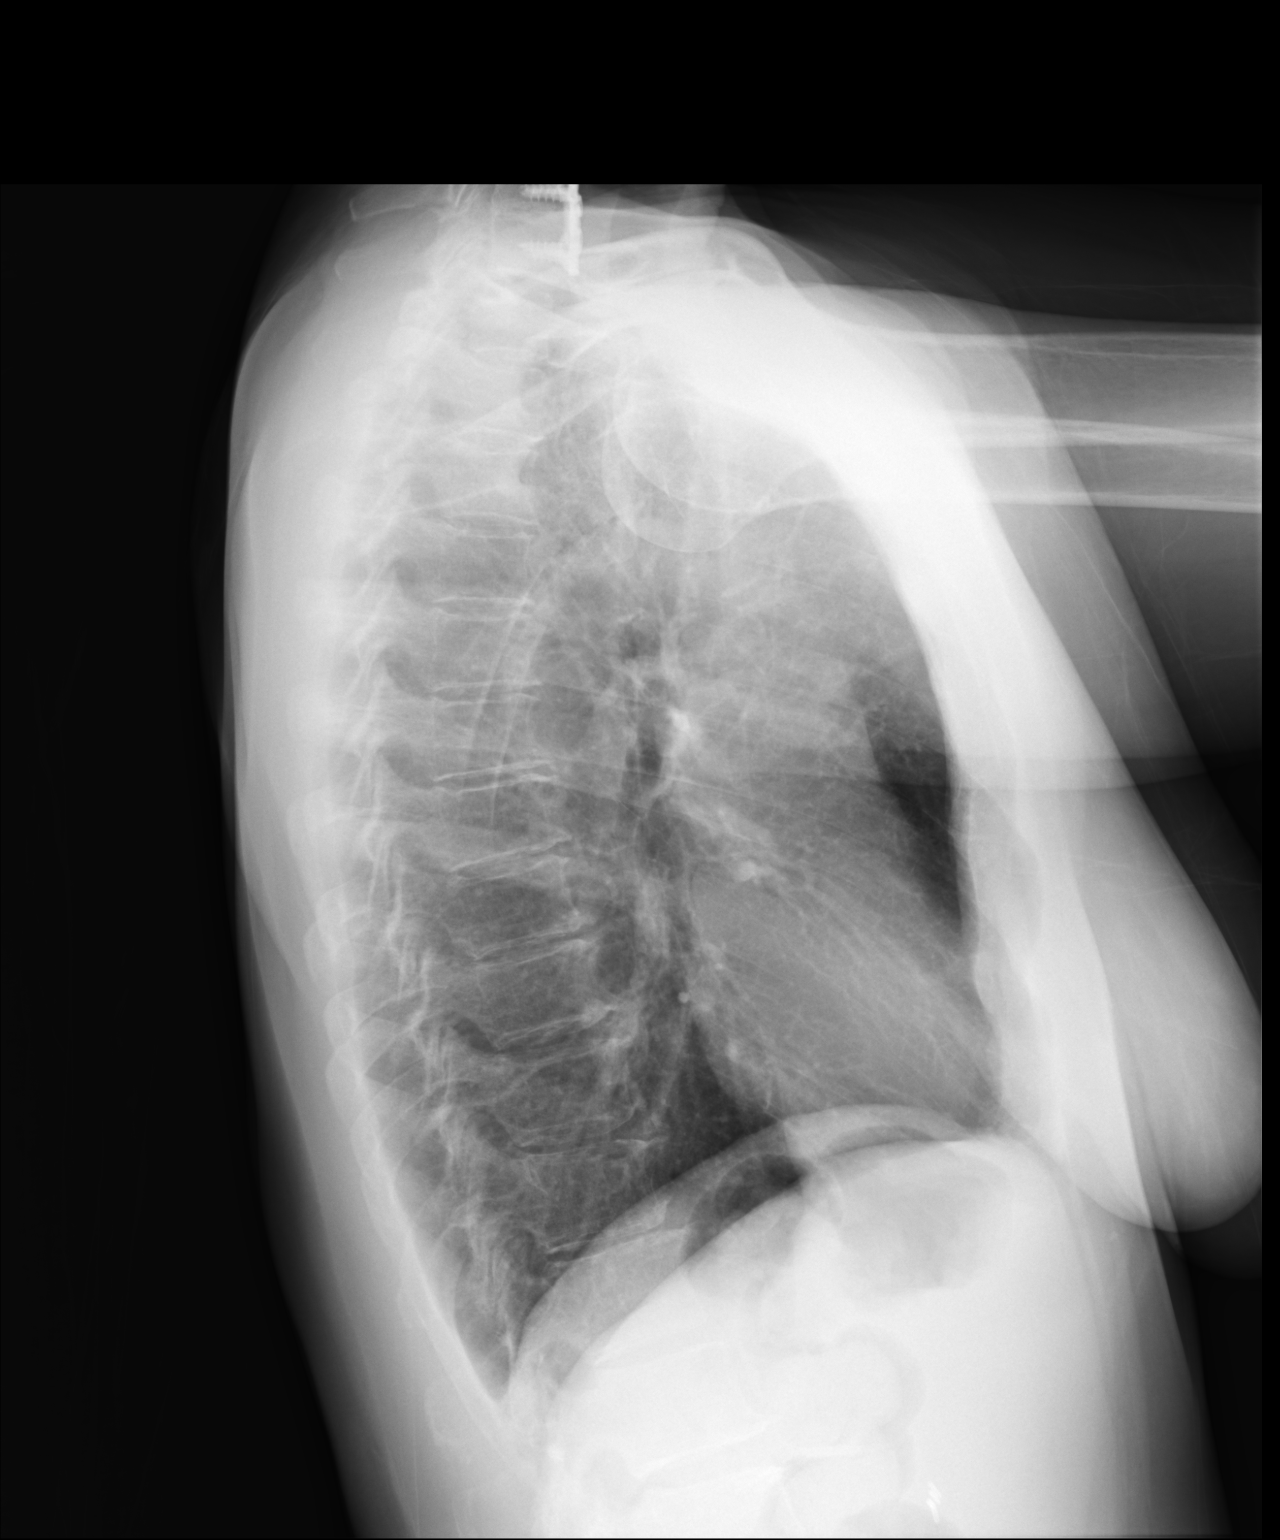

[2 of 2 positions shown; findings below may reference images not displayed]

FINDINGS: Normal heart size. Lungs are clear and well expanded without focal 
infiltrate or consolidation. No pleural effusion. Small RIGHT upper lobe 
calcific granulomata. Mild degenerative thoracic spondylosis. Previous cervical 
fusion.
IMPRESSION: No acute cardiopulmonary findings. Old granulomatous disease. 
If symptoms persist, consider CT chest for improved assessment of the 
interstitium.

## 2021-05-28 IMAGING — CT CT CHEST WITHOUT CONTRAST
2 of 4 series · 15 of 36 positions shown, 18 images · non-contrast
Comparison: None

________________________________________________________________________________________________ 
CT CHEST WITHOUT CONTRAST, 05/28/2021 [DATE]: 
CLINICAL INDICATION: Cough for 9 weeks worse at night. Cough. 2 weeks after 
steroid shot then recurred. 
A search for DICOM formatted images was conducted for prior CT imaging studies 
completed at a non-affiliated media free facility.
TECHNIQUE: The chest was scanned from base of neck through the lung bases 
without contrast on a high resolution low dose CT scanner. Routine MPR and MIP 
3D renderings were reconstructed on an independent workstation with concurrent 
physician supervision.

[Series 2: chest w/o 2.0 i31s 3 · axial · non-contrast · 0.69mm/px · z∈[-416,-170]mm · 12 of 147 slices shown, 15 images]
[im 12/147  mediastinal]
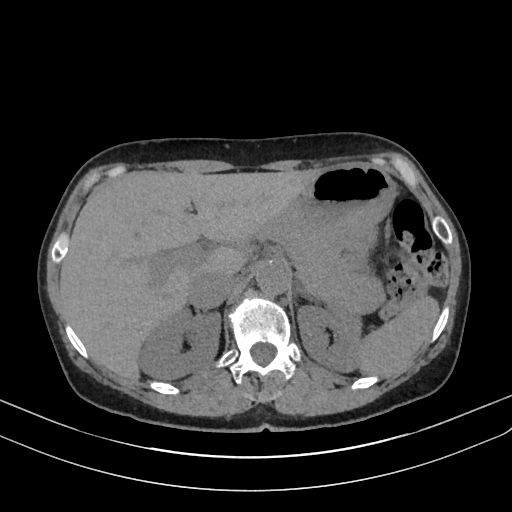
[im 12/147  lung]
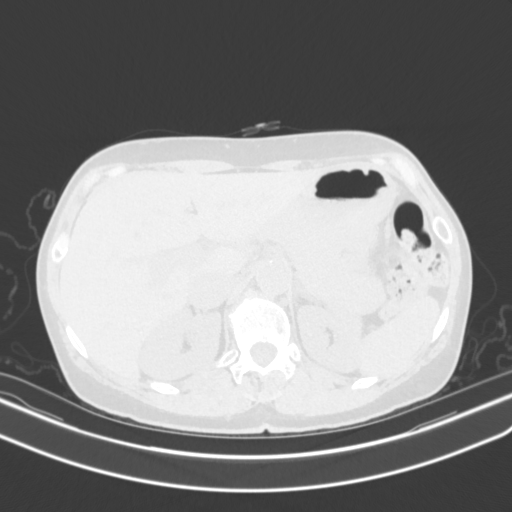
[im 23/147  lung]
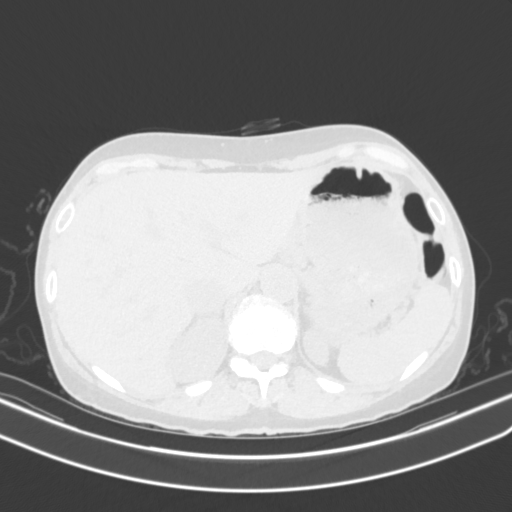
[im 34/147  lung]
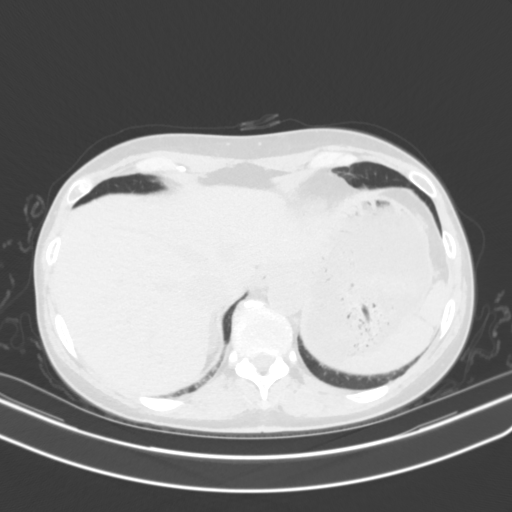
[im 45/147  lung]
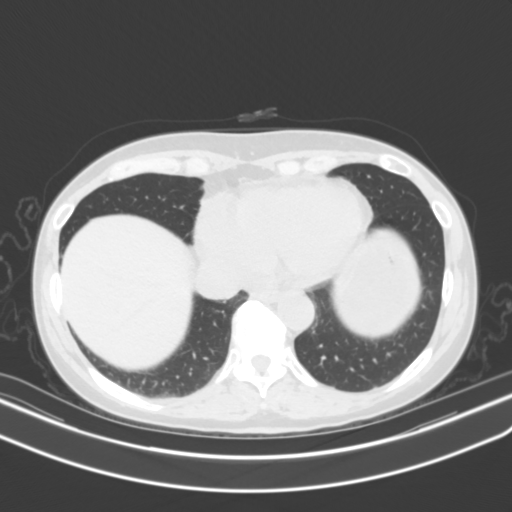
[im 57/147  mediastinal]
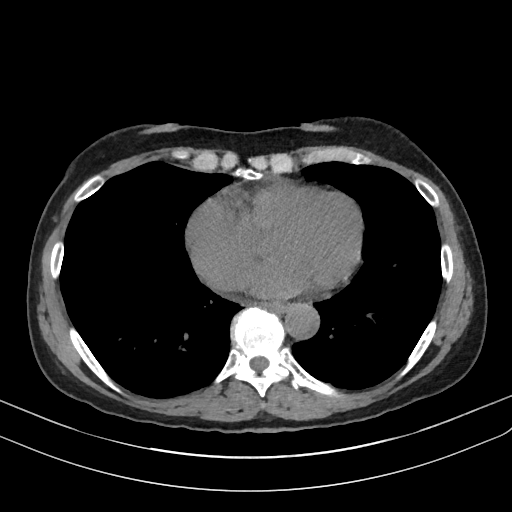
[im 57/147  lung]
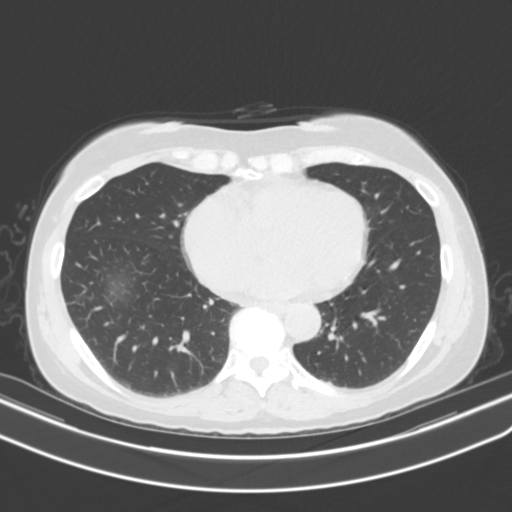
[im 68/147  lung]
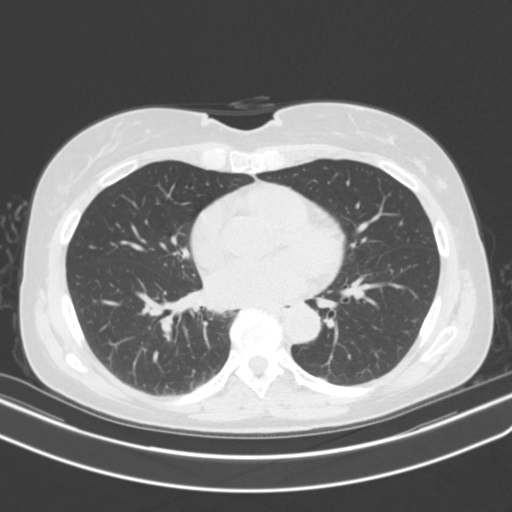
[im 79/147  lung]
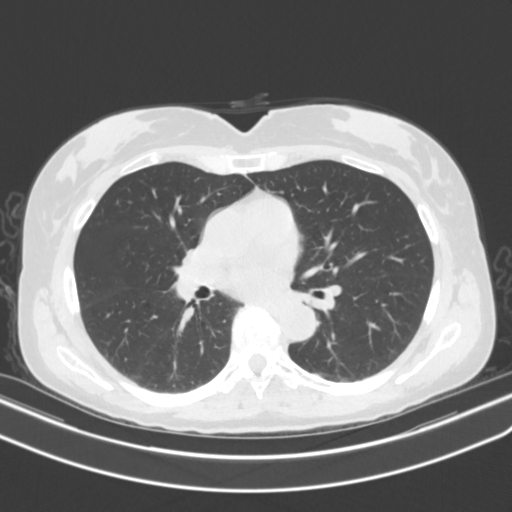
[im 90/147  lung]
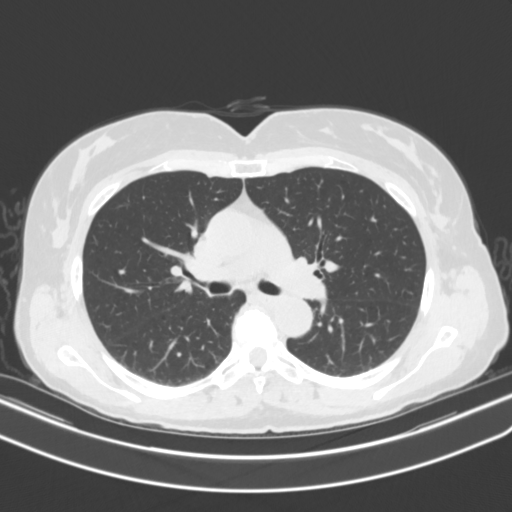
[im 102/147  mediastinal]
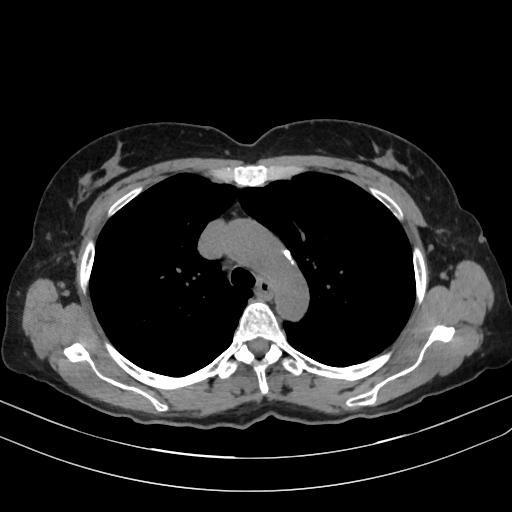
[im 102/147  lung]
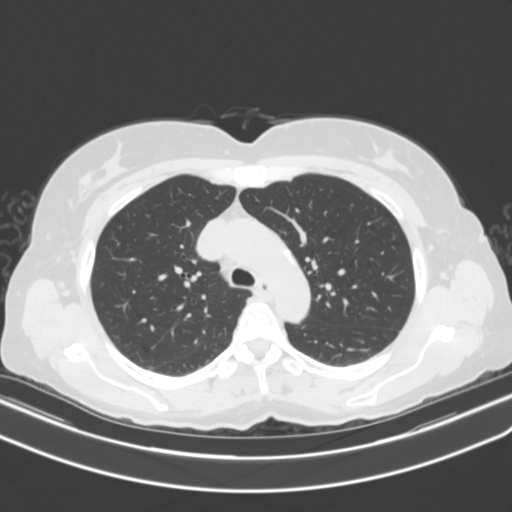
[im 113/147  lung]
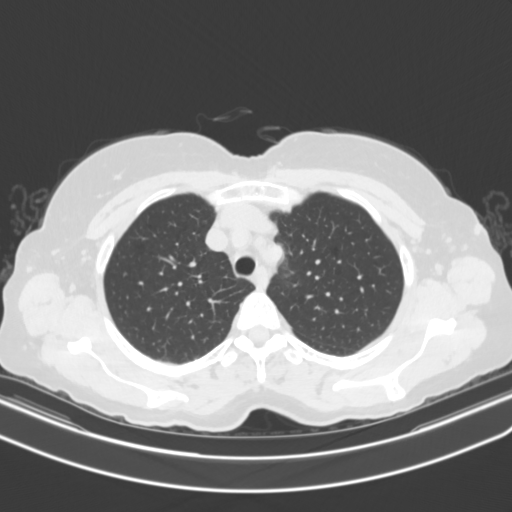
[im 124/147  lung]
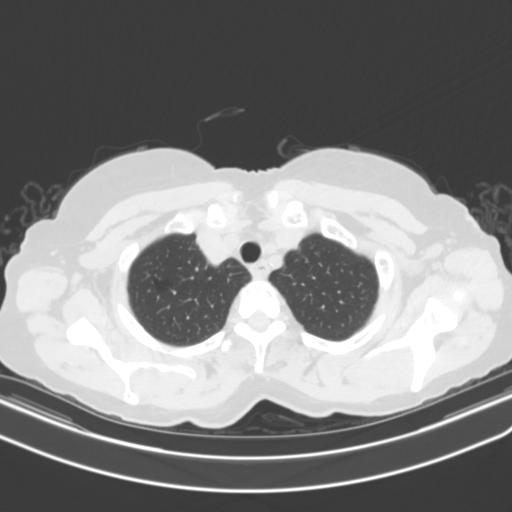
[im 135/147  lung]
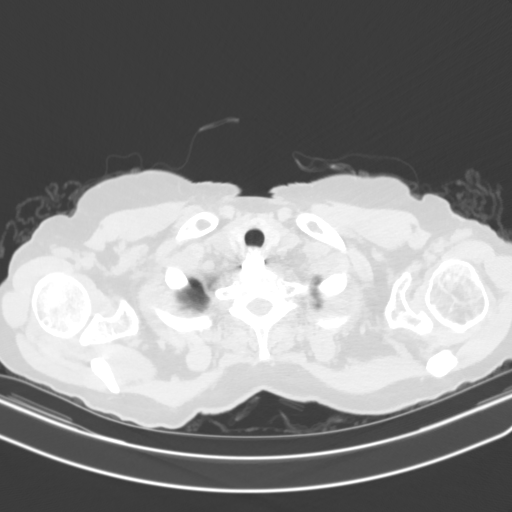

[Series 5: coronal · coronal · 0.59mm/px · 3 of 104 slices shown]
[im 21/104  lung]
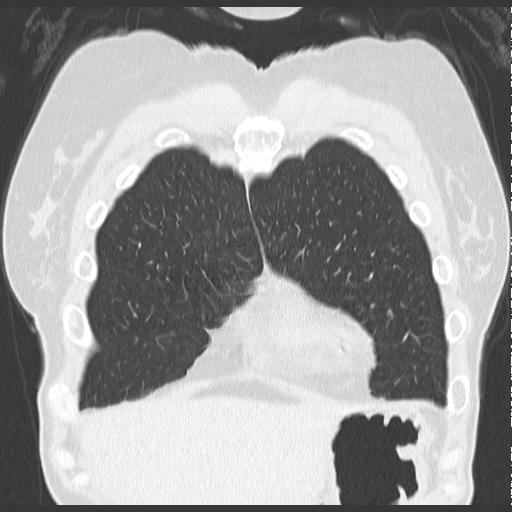
[im 42/104  lung]
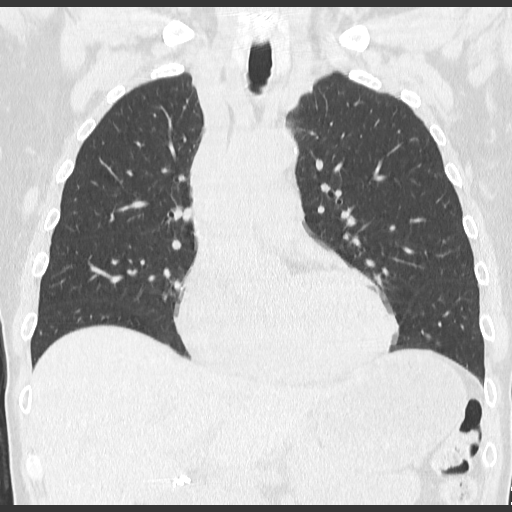
[im 62/104  lung]
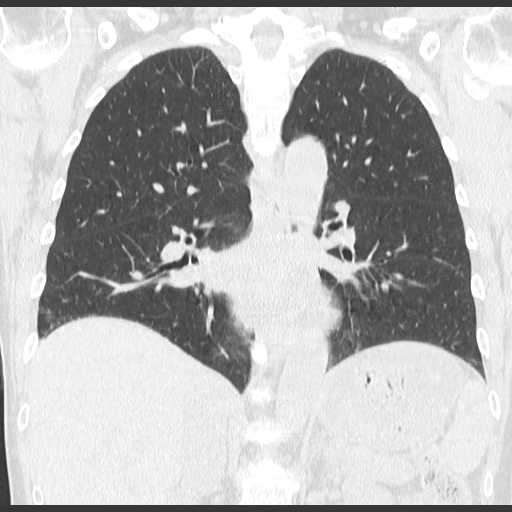

[15 of 36 positions shown; findings below may reference images not displayed]

FINDINGS: LUNGS AND PLEURA:  The lungs are clear without pulmonary nodules, infiltrates or 
effusions. 
MEDIASTINUM:  No adenopathy. Normal heart size. No pericardial effusion. 
CHEST WALL/AXILLA: No mass or adenopathy.  
UPPER ABDOMEN: Previous cholecystectomy 
MUSCULOSKELETAL: No acute abnormality.
IMPRESSION: Lungs are clear without infiltrates, consolidations or effusions. No pulmonary 
nodules. No source seen for cough. 
Previous cholecystectomy. 
RADIATION DOSE REDUCTION: All CT scans are performed using radiation dose 
reduction techniques, when applicable.  Technical factors are evaluated and 
adjusted to ensure appropriate moderation of exposure.  Automated dose 
management technology is applied to adjust the radiation doses to minimize 
exposure while achieving diagnostic quality images.

## 2021-07-16 IMAGING — CT CT SINUS WITHOUT CONTRAST
2 series · 14 of 37 positions shown, 17 images · non-contrast
Comparison: There are no previous exams available for comparison.

________________________________________________________________________________________________ 
CT SINUS WITHOUT CONTRAST, 07/16/2021 [DATE]: 
CLINICAL INDICATION: "Septal button. Multiple surgeries. Headaches for one week. 
A search for DICOM formatted images was conducted for prior CT imaging studies 
completed at a non-affiliated media free facility.
TECHNIQUE: The paranasal sinuses were scanned without contrast on a high 
resolution CT scanner using dose reduction techniques.  Routine MPR 
reconstructions were performed.

[Series 3: axial · axial · 0.28mm/px · z∈[-161,-68]mm · 11 of 155 slices shown, 14 images]
[im 11/155  brain]
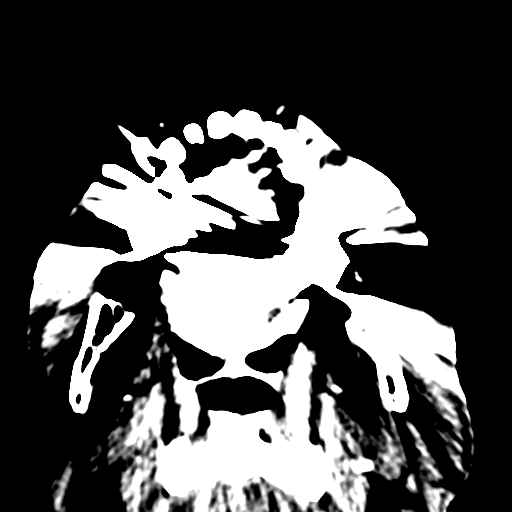
[im 11/155  bone]
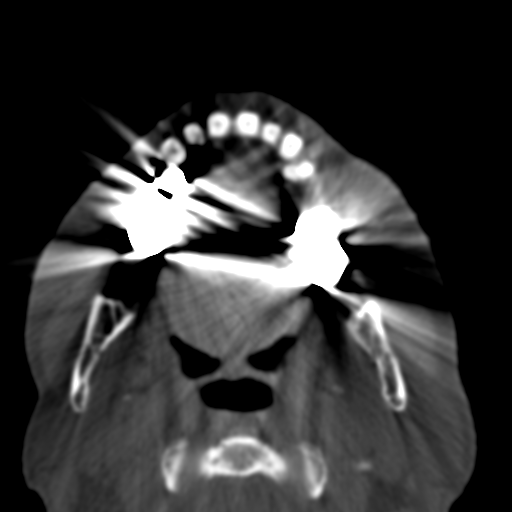
[im 22/155  bone]
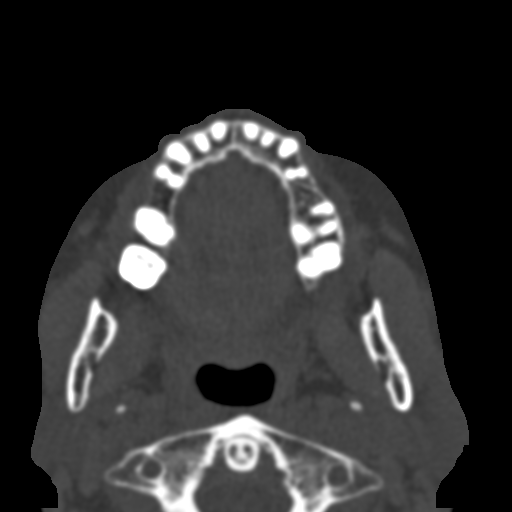
[im 38/155  bone]
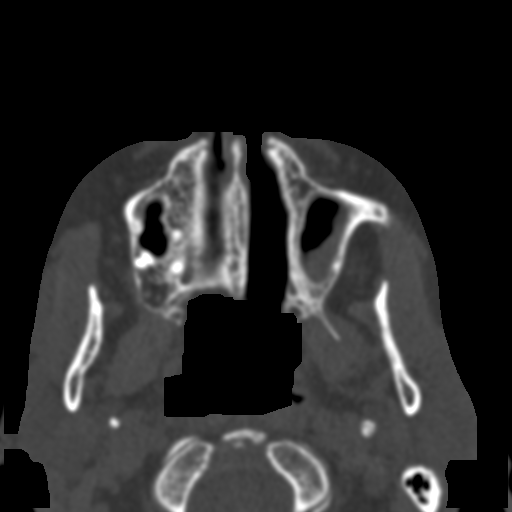
[im 48/155  bone]
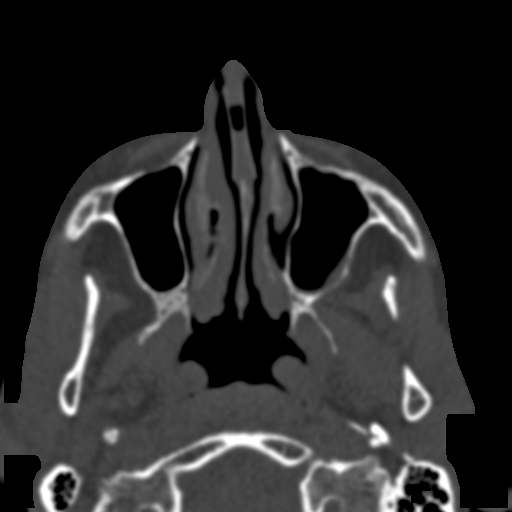
[im 64/155  brain]
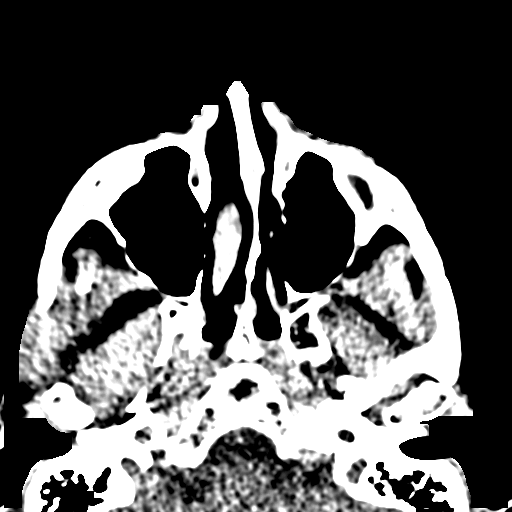
[im 64/155  bone]
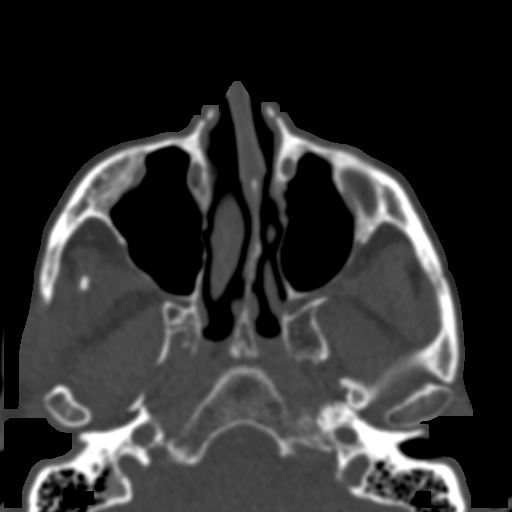
[im 80/155  bone]
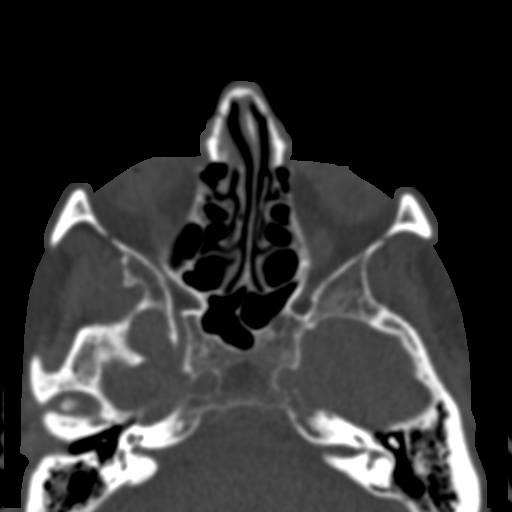
[im 91/155  bone]
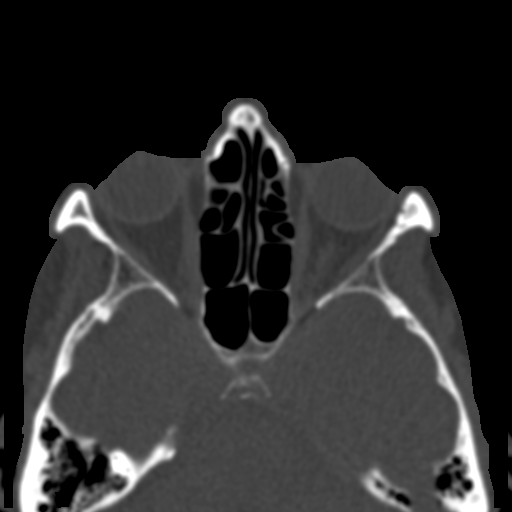
[im 107/155  bone]
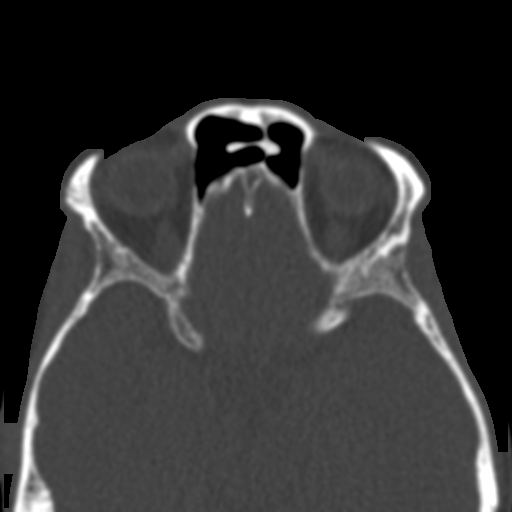
[im 117/155  brain]
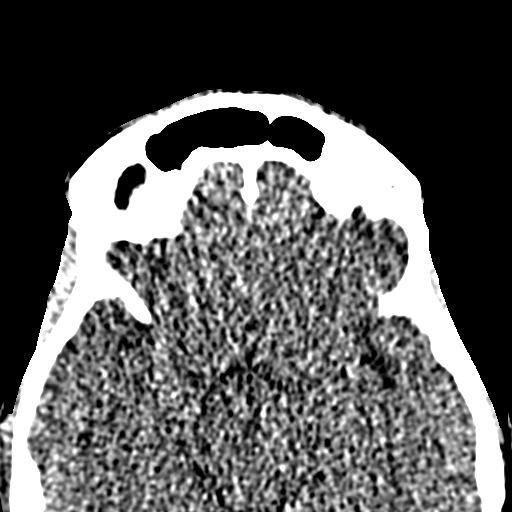
[im 117/155  bone]
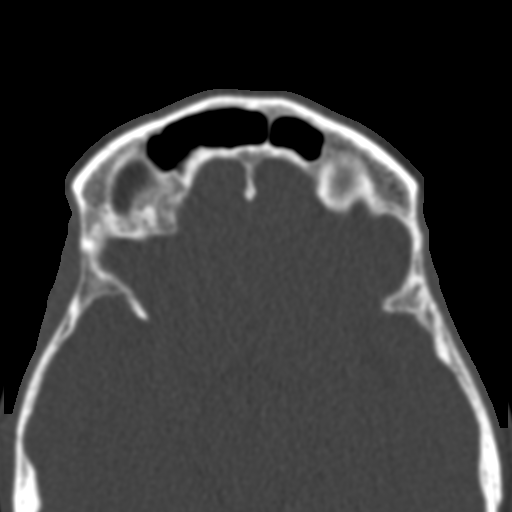
[im 133/155  bone]
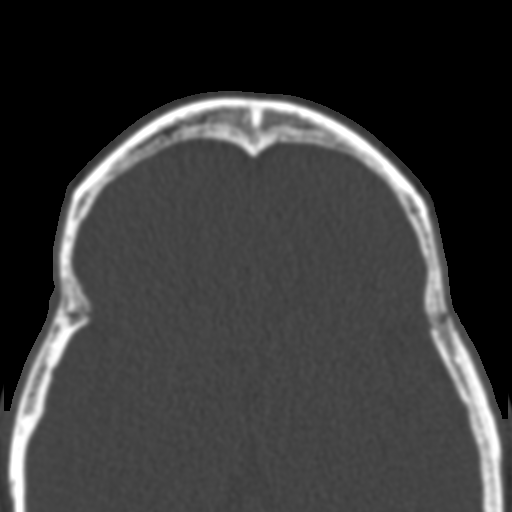
[im 144/155  bone]
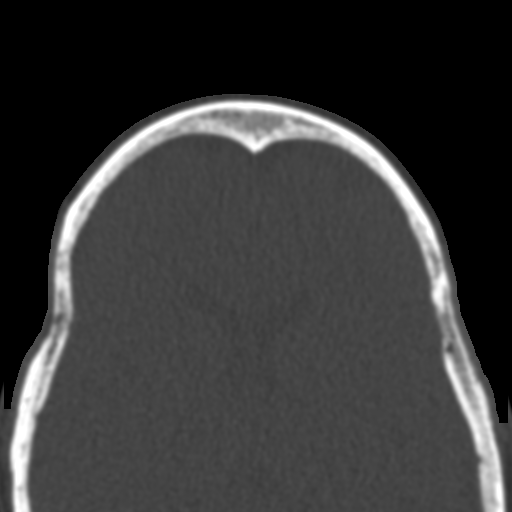

[Series 5: sag · sagittal · 0.20mm/px · 3 of 130 slices shown]
[im 44/130  bone]
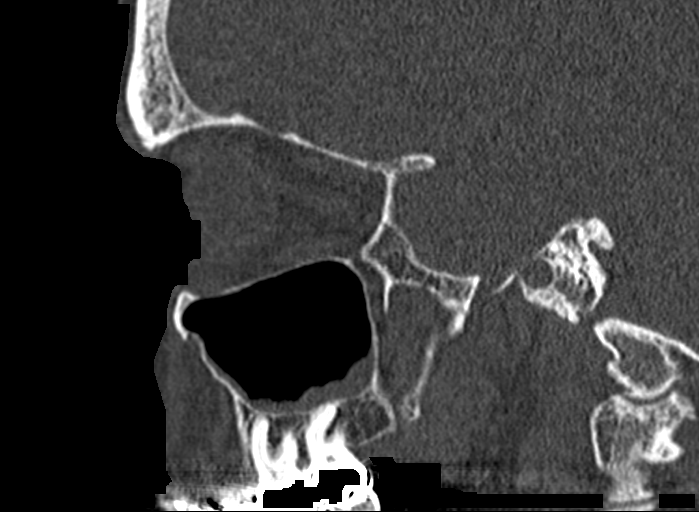
[im 65/130  bone]
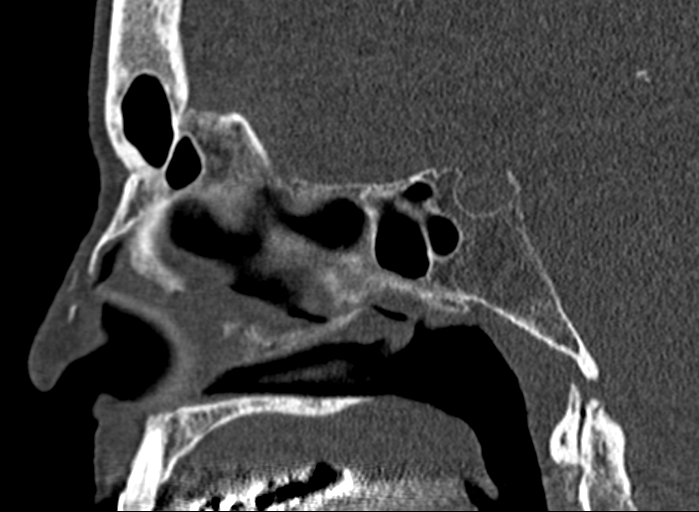
[im 87/130  bone]
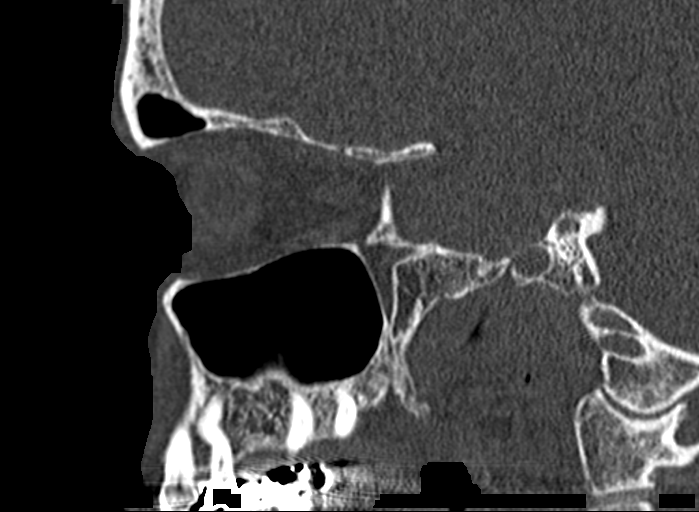

[14 of 37 positions shown; findings below may reference images not displayed]

FINDINGS: FRONTAL SINUSES: Clear. 

ETHMOID AIR CELLS: Clear. 

SPHENOID SINUS: Clear. 

MAXILLARY SINUSES: There is mucosal thickening in the left maxillary sinus 
measuring up to 8 mm in thickness. 

NASAL CAVITY: Reconstruction of the anterior nasal septum with prosthesis in 
place which appears to be preserved.Mild bowing of the nasal septum to the left 
with a small spur. No polyps, concha bullosa, or significant hypertrophic 
turbinate. Suspected previous small nasal fractures with incomplete healing. 

NASOPHARYNX: Adenoids within normal limits for age. No mass in the distribution 
of the visualized eustachian tube present. Parapharyngeal space clear.   

OSTIOMEATAL UNITS: Bilaterally patent. 

REMAINDER OF SKULL INCLUDING ORBITS AND SURROUNDING SOFT TISSUES: Calcification 
in the right frontal lobe near the right superior orbital roof measuring up to 9 
mm x 4 mm and possible surrounding edema which could represent a small calcified 
meningioma or bony hyperostosis. Would recommend follow-up with MRI of the brain 
with and without IV contrast. This could be potential source for headache.
IMPRESSION: Calcification in the right frontal lobe near the right superior orbital roof 
measuring up to 9 mm x 4 mm and possible surrounding edema which could represent 
a small calcified meningioma or bony hyperostosis. Would recommend follow-up 
with MRI of the brain with and without IV contrast. This could be potential 
source for headache. 
Mild mucosal thickening of the left maxillary sinus measuring up to 8 mm in 
thickness. 
Mild bowing of the nasal septum to the left with small spur. Reconstruction of 
the anterior nasal septum with prosthesis in place which is preserved. 
 Suspected previous small nasal fractures with incomplete healing, likely from 
previous reconstructions. 
RADIATION DOSE REDUCTION: All CT scans are performed using radiation dose 
reduction techniques, when applicable.  Technical factors are evaluated and 
adjusted to ensure appropriate moderation of exposure.  Automated dose 
management technology is applied to adjust the radiation doses to minimize 
exposure while achieving diagnostic quality images.

## 2021-08-11 IMAGING — MR MRI BRAIN W/WO CONTRAST
6 of 13 series · 18 of 48 positions shown · IV contrast (gadavist)
Comparison: CT sinus July 16, 2021

________________________________________________________________________________________________ 
MRI BRAIN W/WO CONTRAST, 08/11/2021 [DATE]: 
CLINICAL INDICATION: 65-year-old female with headache. Cough. History of head 
trauma as a child.
TECHNIQUE: Multiplanar, multiecho position MR images of the brain were performed 
without and with 6 mL of Gadavist were injected intravenously by hand. 1.5 mL of 
Gadavist was discarded.  Patient was scanned on a 1.5T magnet. The patients 
eGFR was calculated to be 72 mL/min/1.73 m2 using the i-STAT device.

[Series 101: survey · axial · 10.0mm · 0.94mm/px · 1 of 5 slices shown]
[im 1/5]
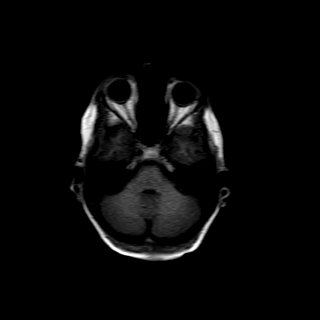

[Series 201: t1_se_sag · sagittal · 4.0mm · 0.49mm/px · 2 of 29 slices shown]
[im 1/29]
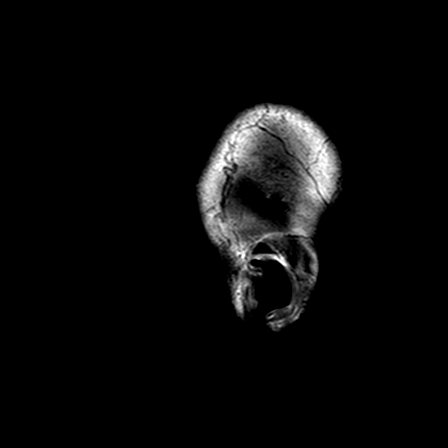
[im 29/29]
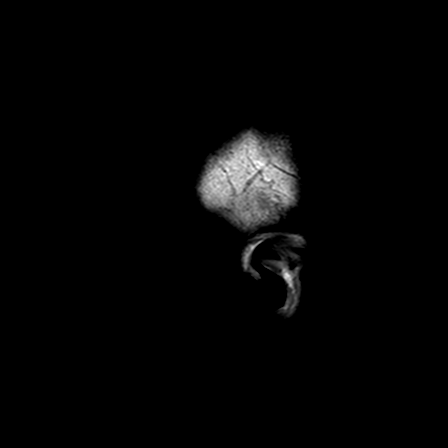

[Series 301: flair_ax · axial · 5.0mm · 0.49mm/px · z∈[-75,+96]mm · 2 of 30 slices shown]
[im 1/30]
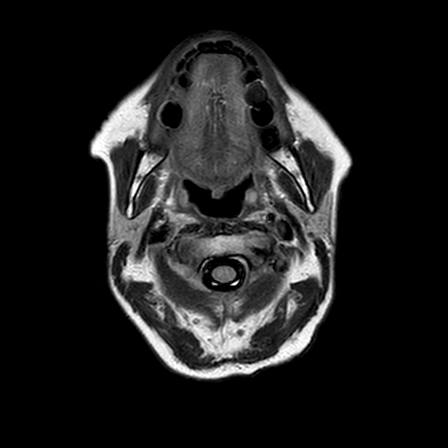
[im 30/30]
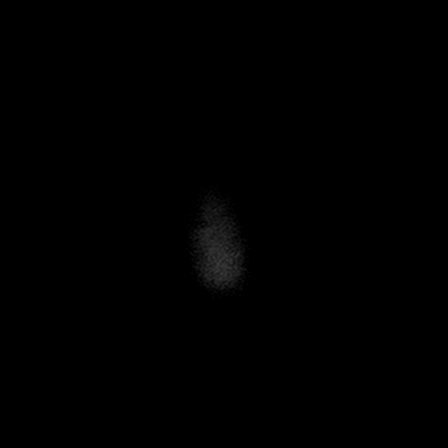

[Series 601: SWI · axial · 3.0mm · 0.40mm/px · z∈[-60,+85]mm · 8 of 200 slices shown]
[im 1/200]
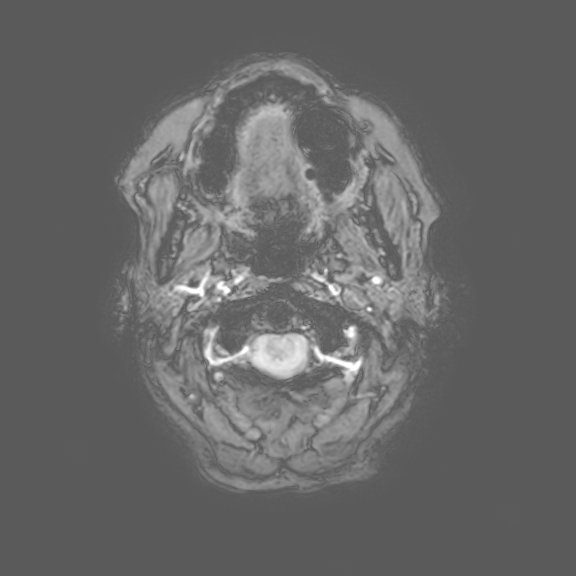
[im 31/200]
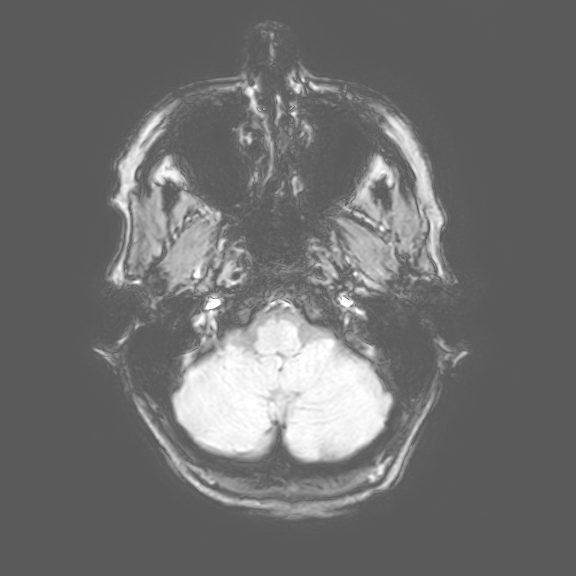
[im 62/200]
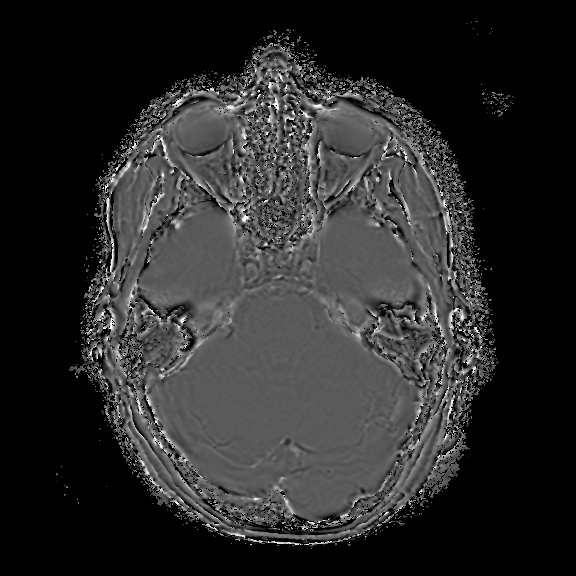
[im 92/200]
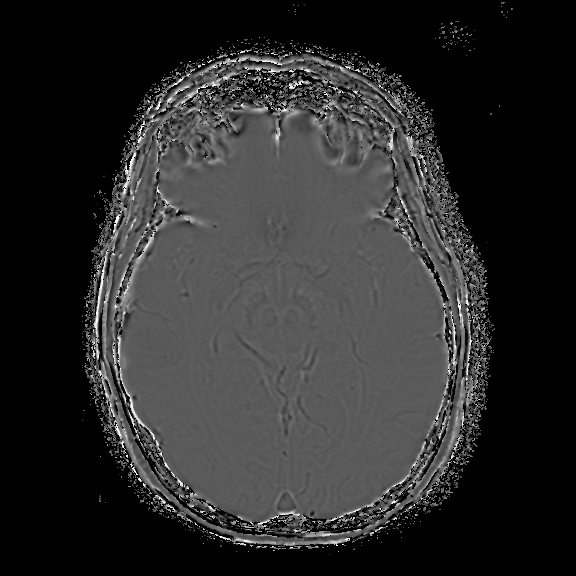
[im 108/200]
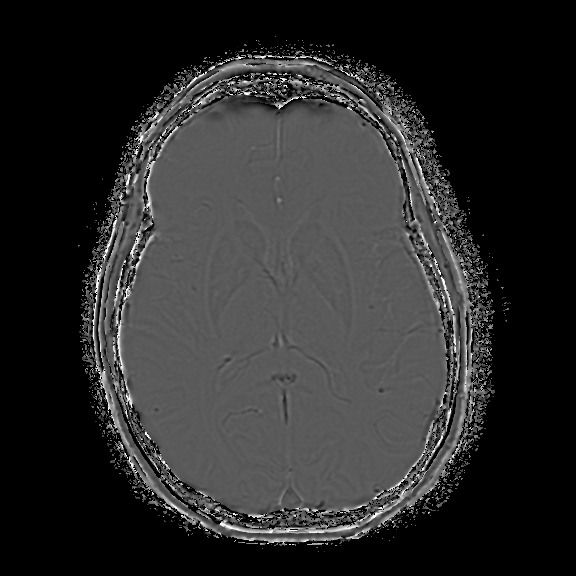
[im 138/200]
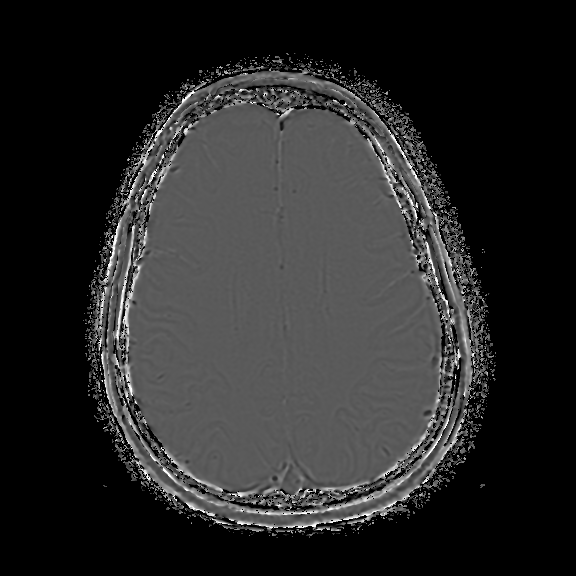
[im 169/200]
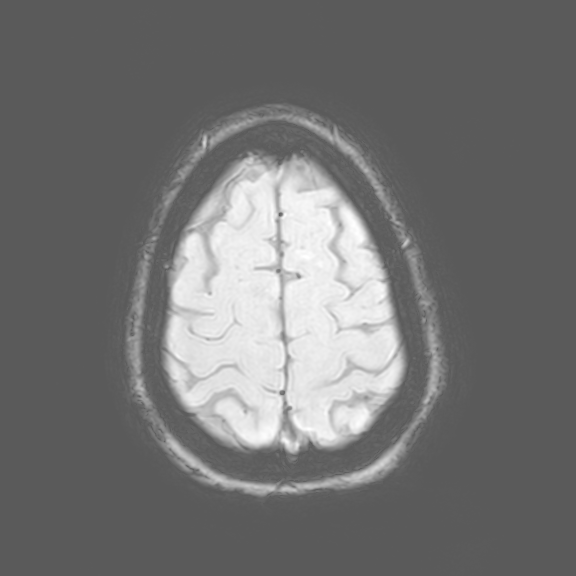
[im 200/200]
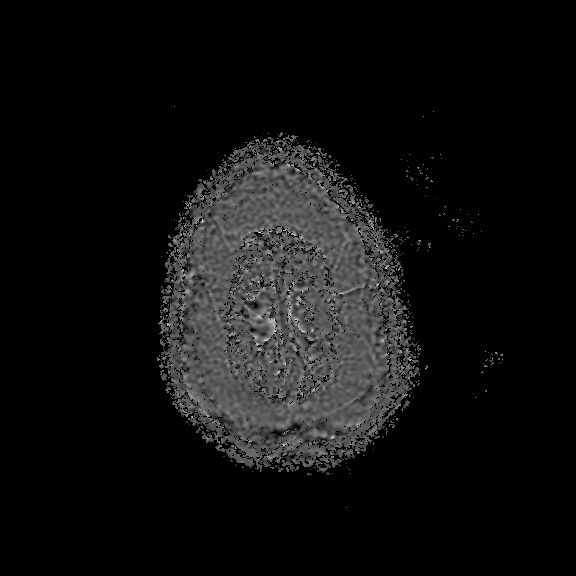

[Series 801: T1 post-contrast · axial · 5.0mm · 0.43mm/px · z∈[-75,+96]mm · 2 of 30 slices shown (1 of 2)]
[im 1/30]
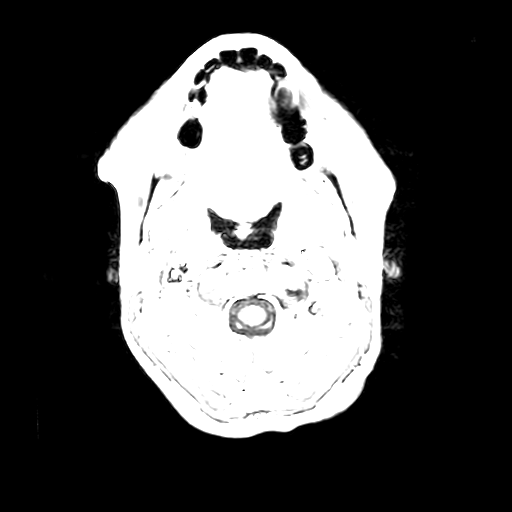
[im 30/30]
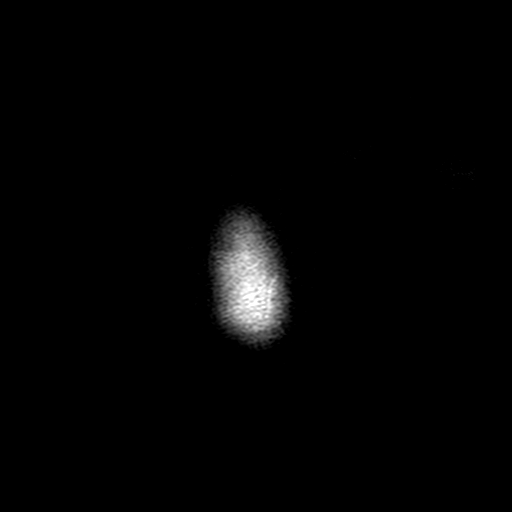

[Series 901: T1 post-contrast · coronal · 4.0mm · 0.41mm/px · 3 of 36 slices shown (2 of 2)]
[im 1/36]
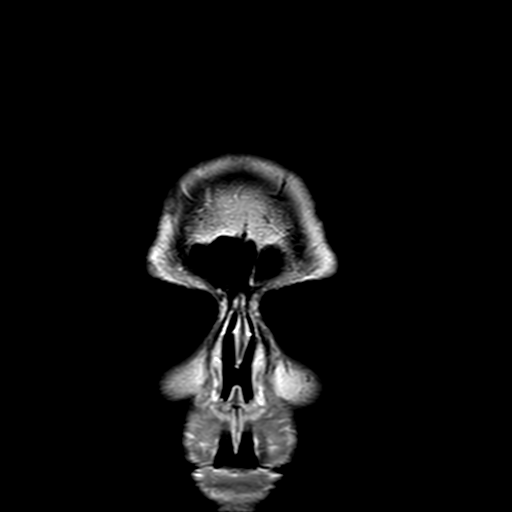
[im 18/36]
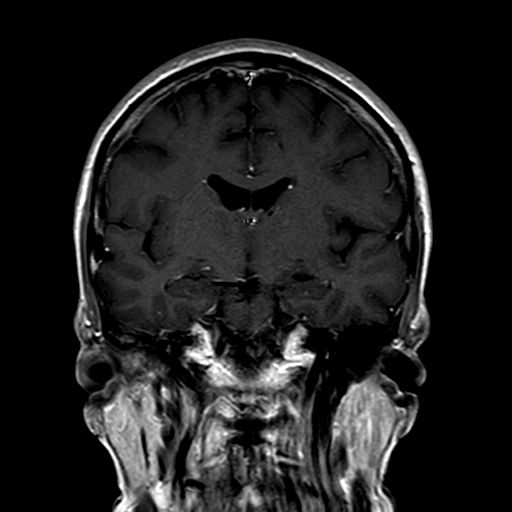
[im 36/36]
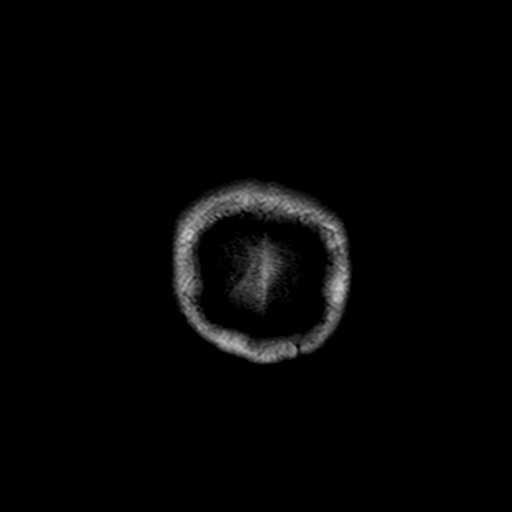

[18 of 48 positions shown; findings below may reference images not displayed]

FINDINGS: Intracranial contents: No acute or subacute infarct. Normal sellar region given 
the limitations of technique. Cerebellar tonsils are appropriately located. No 
intracranial hemorrhage of any type. The previously noted mineralized lesion 
projecting superiorly off the right superior orbital roof is again identified 
but does not demonstrate any significant enhancement. This lesion measures
mm AP by 6.7 mm transverse by 5.3 mm craniocaudal. There is no associated brain 
parenchymal edema. Lesion is most consistent with an exostosis in this location. 
There are subcortical and deep white matter punctate and confluent foci of T2 
FLAIR hyperintensity which are mild-to-moderate in degree, nonspecific, but 
likely reflective of chronic small vessel vascular change. No mass, mass effect, 
midline shift, or abnormal extra-axial fluid collection. The mild generalized 
brain volume loss which is slightly more conspicuous involving the frontal lobes 
bilaterally. No hydrocephalus. Appropriate skull base vascular flow voids. No 
abnormal intracranial enhancement. 
Orbits, sinuses, and temporal bones: Bilateral pseudophakia. Sinuses are clear. 
No mastoid or middle ear effusion. 
Bones and soft tissues: Normal marrow signal. No soft tissue swelling.
IMPRESSION: No acute intracranial process. 
Stable mineralizing lesion projecting superiorly off the right superior orbital 
roof appears stable and without enhancement. It is most suggestive of an 
exostosis in this location. 
Chronic appearing parenchymal changes detailed above with somewhat asymmetric 
frontal lobe atrophy bilaterally. Could consider correlation with PET imaging 
for metabolic status.

## 2021-12-14 IMAGING — CT CT ABDOMEN AND PELVIS W/O IV CONT W/ ORAL CONT
2 of 3 series · 9 of 46 positions shown, 10 images · non-contrast
Comparison: CT chest May 28, 2021.

________________________________________________________________________________________________ 
CT ABDOMEN AND PELVIS W/O IV CONT W/ ORAL CONT, 12/14/2021 [DATE]: 
CLINICAL INDICATION: Melena. Prior hernia surgery x2. Prior renal surgery. 
Previous cholecystectomy. Lower abdominal pain. 
A search for DICOM formatted images was conducted for prior CT imaging studies 
completed at a non-affiliated media free facility.
TECHNIQUE: The abdomen and pelvis were scanned from lung bases through the 
pubic rami without contrast on a high-resolution CT scanner using dose reduction 
techniques. Patient drank diluted oral contrast for this study. Routine MPR 
reconstructions were performed.

[Series 201: axial w/o, idose (3) · axial · non-contrast · 0.88mm/px · z∈[+747,+1134]mm · 6 of 161 slices shown, 7 images]
[im 16/161  soft-tissue]
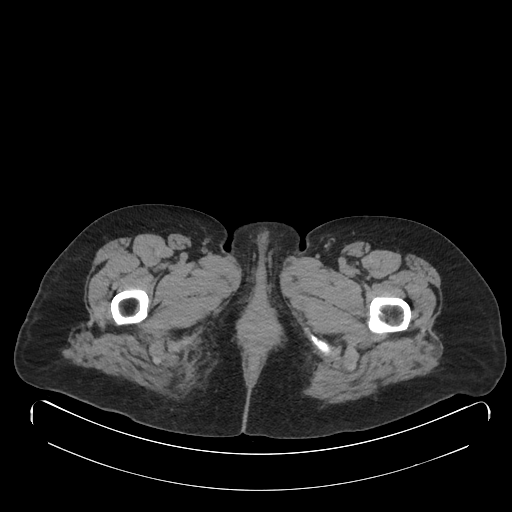
[im 16/161  bone]
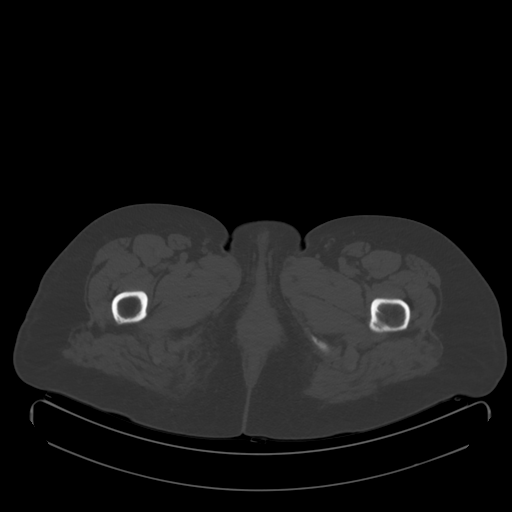
[im 42/161  soft-tissue]
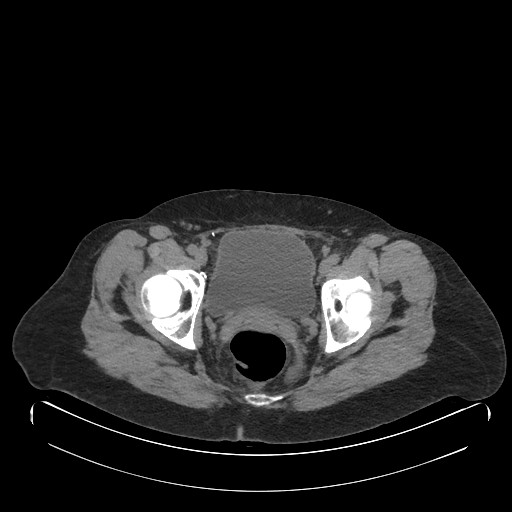
[im 68/161  soft-tissue]
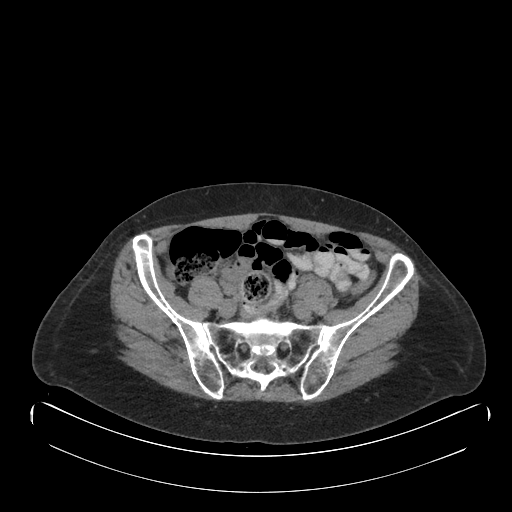
[im 93/161  soft-tissue]
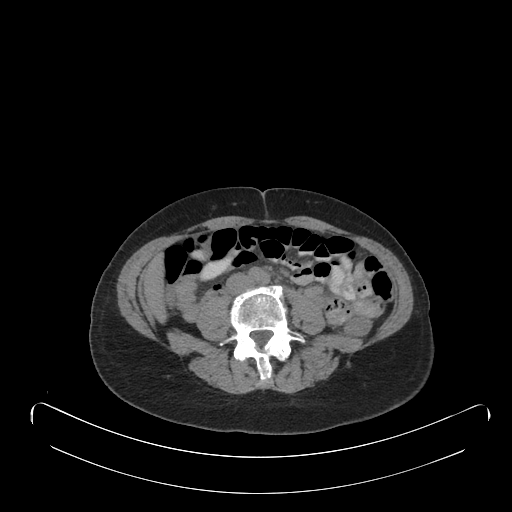
[im 119/161  soft-tissue]
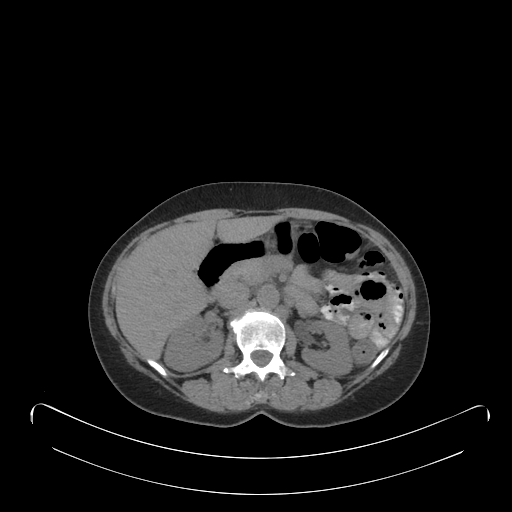
[im 145/161  soft-tissue]
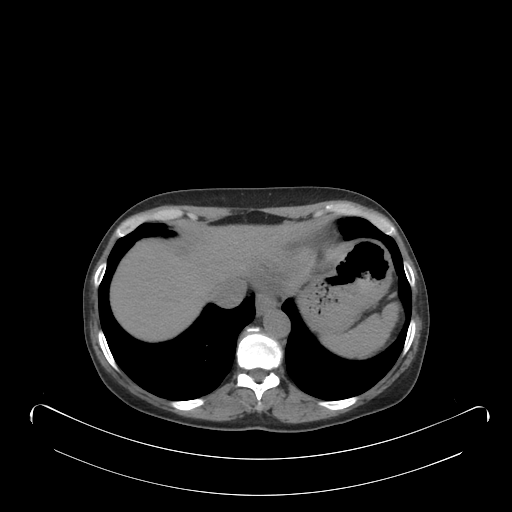

[Series 202: coronal w/o, idose (3) · coronal · non-contrast · 0.45mm/px · 3 of 95 slices shown]
[im 32/95  soft-tissue]
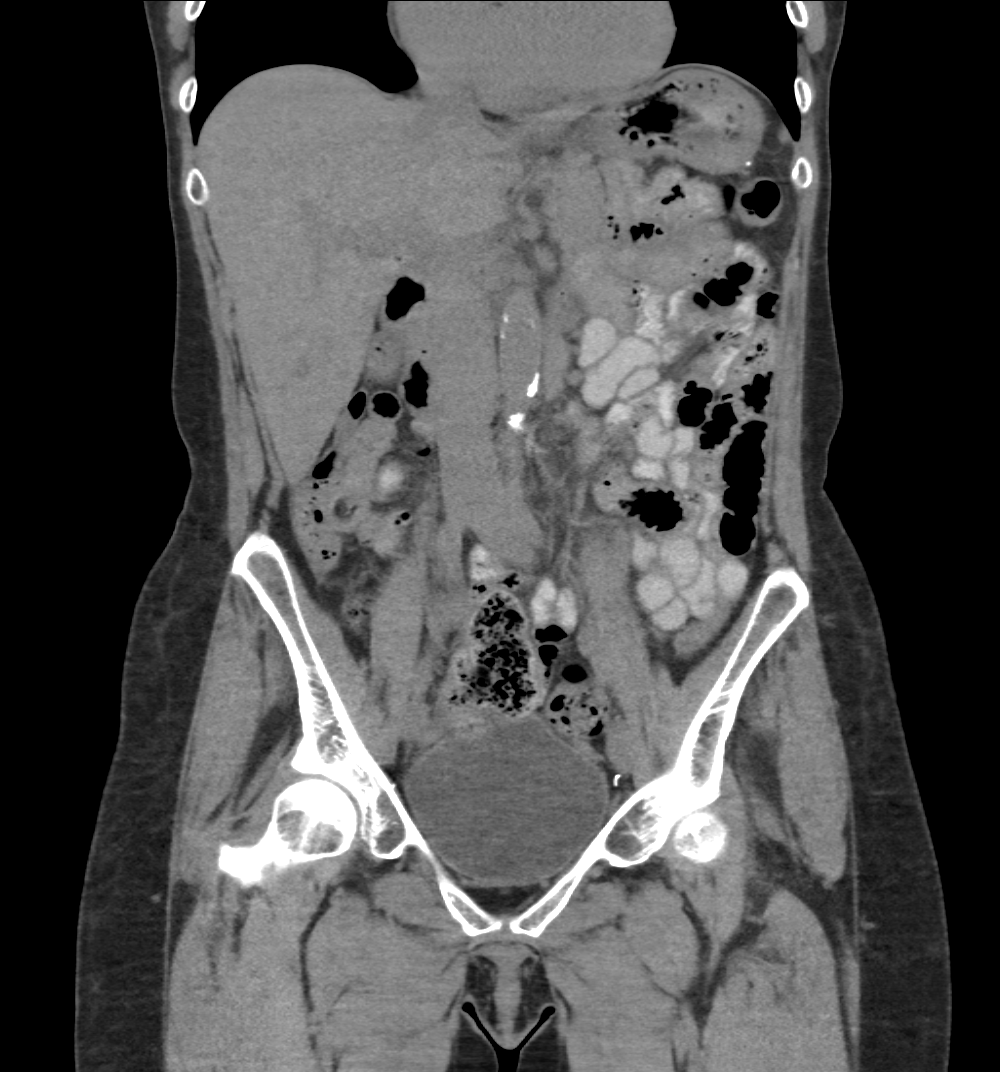
[im 42/95  soft-tissue]
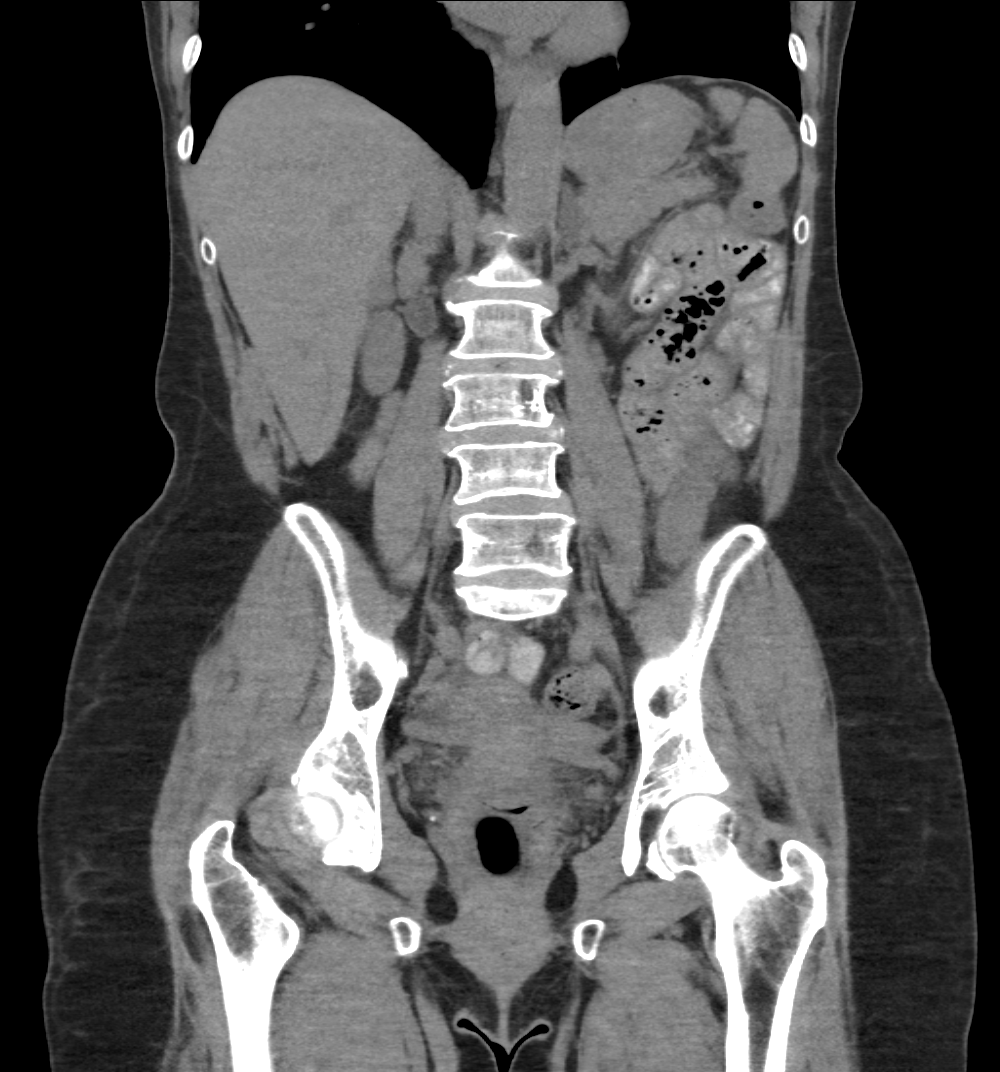
[im 53/95  soft-tissue]
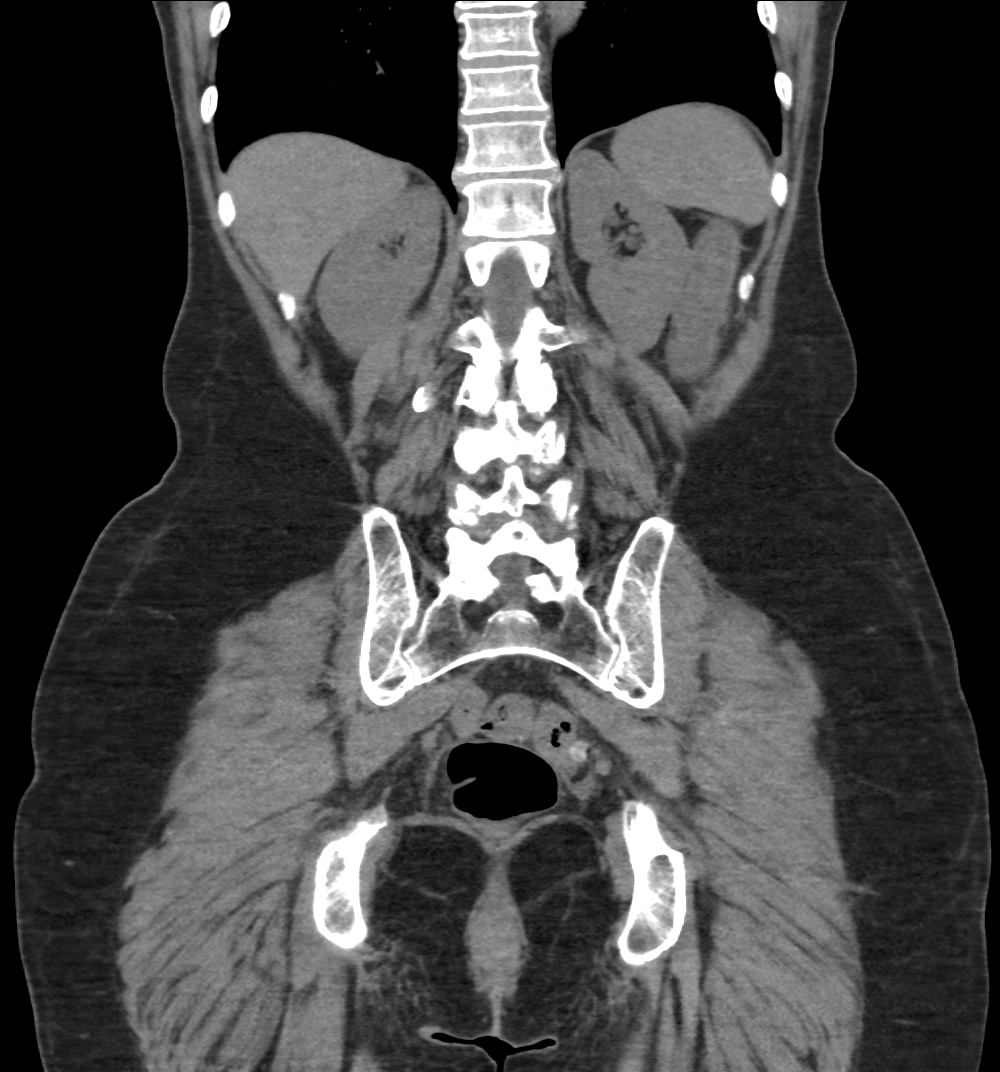

[9 of 46 positions shown; findings below may reference images not displayed]

FINDINGS: LUNG BASES: Lung bases are clear. No pleural effusions. 
HEPATOBILIARY: Non contrast images show no mass or biliary dilatation. 
Cholecystectomy. 
SPLEEN: Normal in size. 
PANCREAS: No evidence for ductal dilatation or mass.   
ADRENALS: No mass. 
GENITOURINARY: No hydronephrosis or kidney stones. Bladder is unremarkable. 
Uterus present. No free fluid. No adnexal mass. 
LYMPH NODES: No adenopathy given limitations of no intravenous contrast. 
STOMACH, SMALL BOWEL AND COLON: There is some high attenuation material within 
the gastric lumen. While this may simply reflect ingested contents, blood 
products could present in a similar fashion and should be correlated clinically. 
Colonic diverticulosis. There is nonspecific stranding surrounding the 
descending colon, extending down to the descending colon sigmoid colon junction. 
Correlate for the possibility of low-grade colitis. No evidence of free air or 
abscess. No bowel obstruction. 
VASCULAR STRUCTURES: No aneurysm.  
MUSCULOSKELETAL: No acute osseous abnormality. Scattered degenerative changes. 
Postsurgical changes of bilateral inguinal hernia repair. 
ADDITIONAL FINDINGS: None.
IMPRESSION: There is some high attenuation material within the gastric lumen. This may 
simply reflect ingested contents, although blood products could present a 
similar fashion and should be correlated clinically. 
Mild nonspecific stranding is present around a decompressed descending colon 
extending to the descending colon sigmoid junction. Correlate for low-grade 
colitis. 
No evidence of free air, abscess, or bowel obstruction. 
Previous cholecystectomy and bilateral groin hernia repairs. 
RADIATION DOSE REDUCTION: All CT scans are performed using radiation dose 
reduction techniques, when applicable.  Technical factors are evaluated and 
adjusted to ensure appropriate moderation of exposure.  Automated dose 
management technology is applied to adjust the radiation doses to minimize 
exposure while achieving diagnostic quality images.

## 2021-12-15 IMAGING — MR MRI CERVICAL SPINE WITHOUT CONTRAST
6 series · 30 of 48 positions shown · IV contrast (gadolinium)
Comparison: Brain MRI August 11, 2021.

________________________________________________________________________________________________ 
MRI CERVICAL SPINE WITHOUT CONTRAST, 12/15/2021 [DATE]: 
CLINICAL INDICATION: Radiculopathy, cervical region. History of headaches and 
neck pain.
TECHNIQUE: Multiplanar, multiecho position MR images of the cervical spine were 
performed without intravenous gadolinium enhancement. Patient was scanned on a 
1.5T magnet.

[Series 101: survey* · axial · 10.0mm · 1.56mm/px · z∈[-30,+199]mm · 6 of 15 slices shown]
[im 1/15]
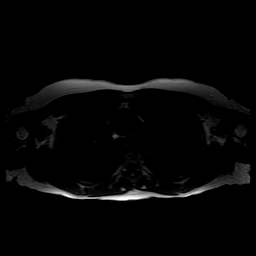
[im 3/15]
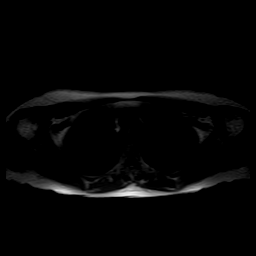
[im 6/15]
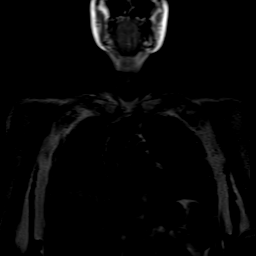
[im 9/15]
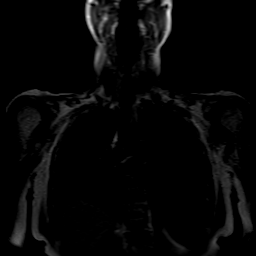
[im 12/15]
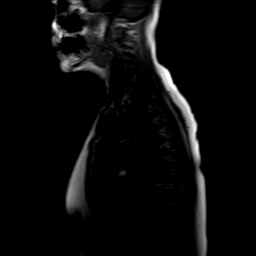
[im 15/15]
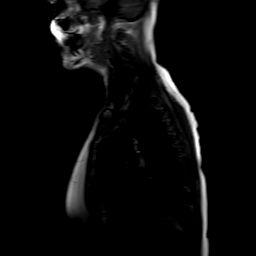

[Series 201: t2w_cor-surv · coronal · 5.0mm · 0.85mm/px · 5 of 11 slices shown]
[im 1/11]
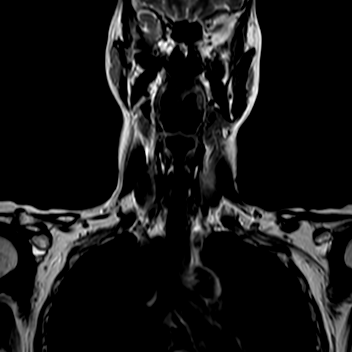
[im 3/11]
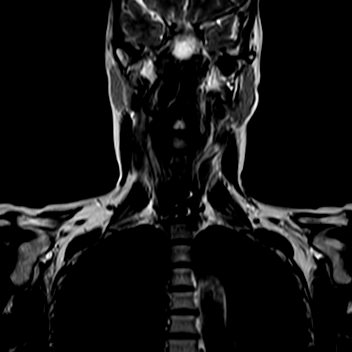
[im 6/11]
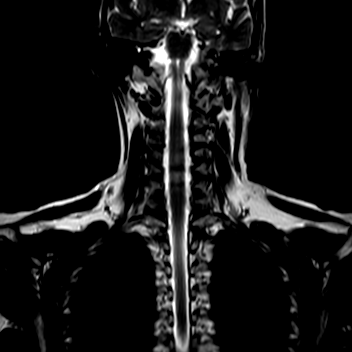
[im 8/11]
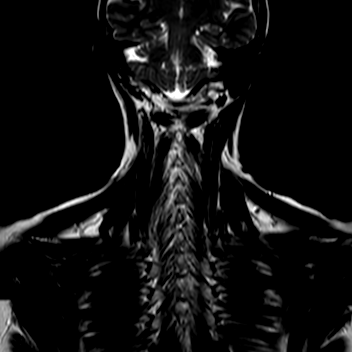
[im 11/11]
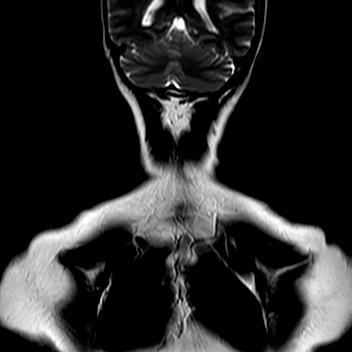

[Series 301: t1_(person_name) · sagittal · 3.0mm · 0.41mm/px · 6 of 15 slices shown]
[im 1/15]
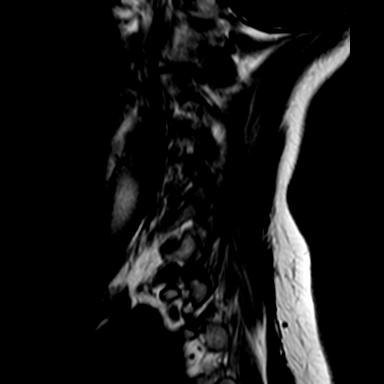
[im 3/15]
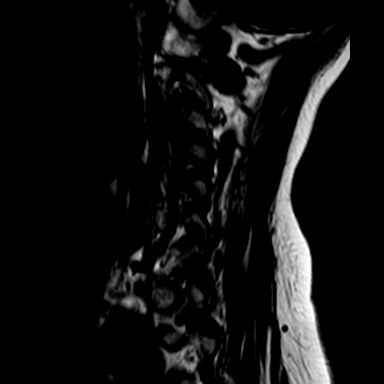
[im 6/15]
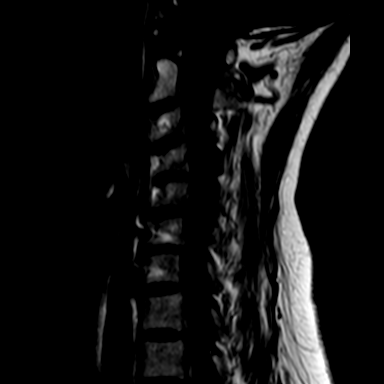
[im 9/15]
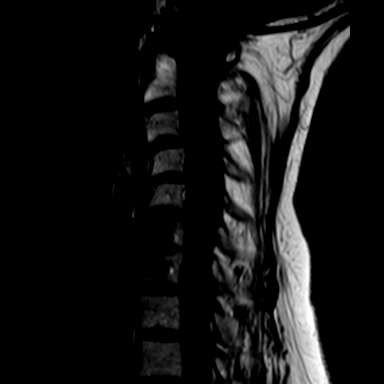
[im 12/15]
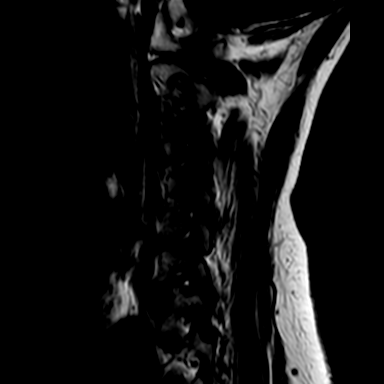
[im 15/15]
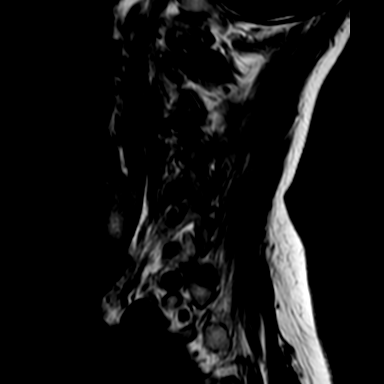

[Series 401: t2w_mv_xd_sag · sagittal · 3.0mm · 0.31mm/px · 6 of 15 slices shown]
[im 1/15]
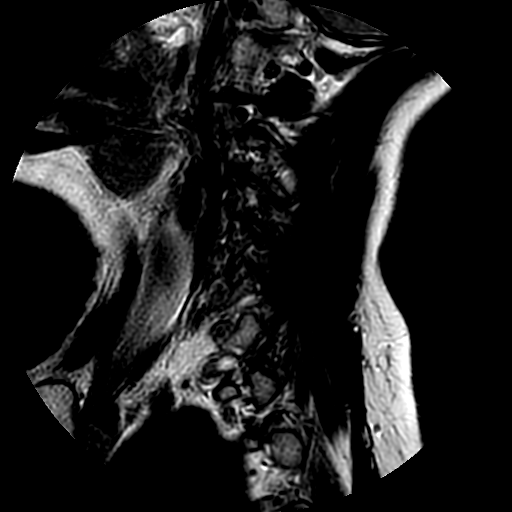
[im 3/15]
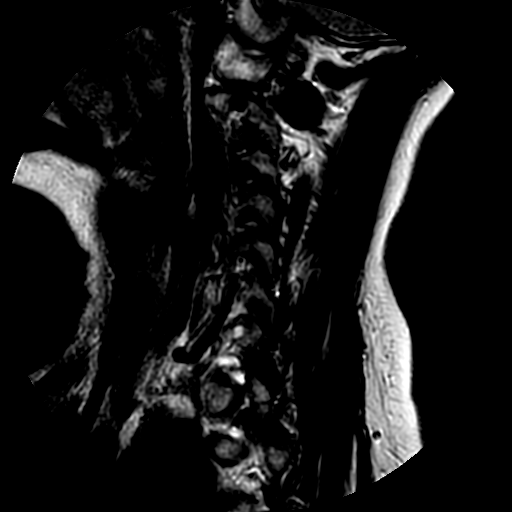
[im 6/15]
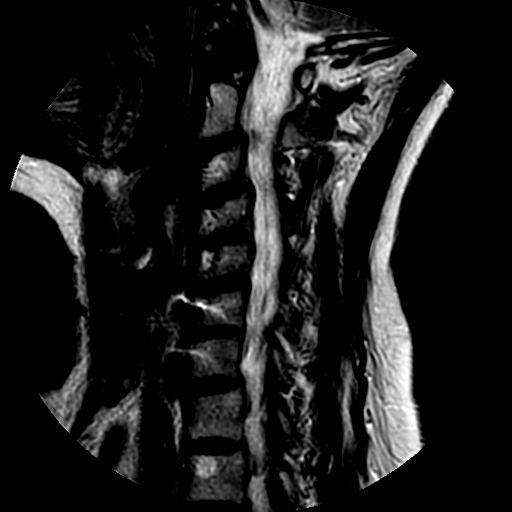
[im 9/15]
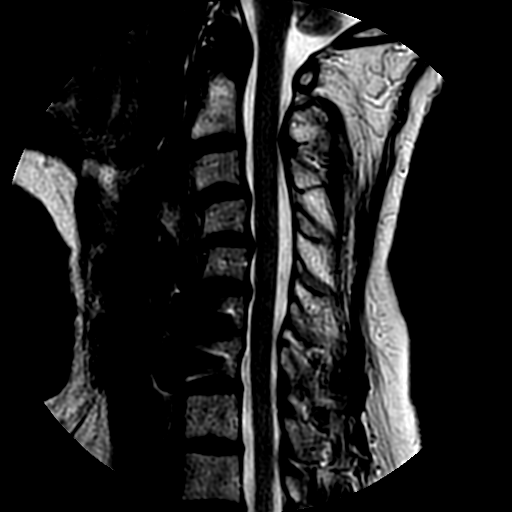
[im 12/15]
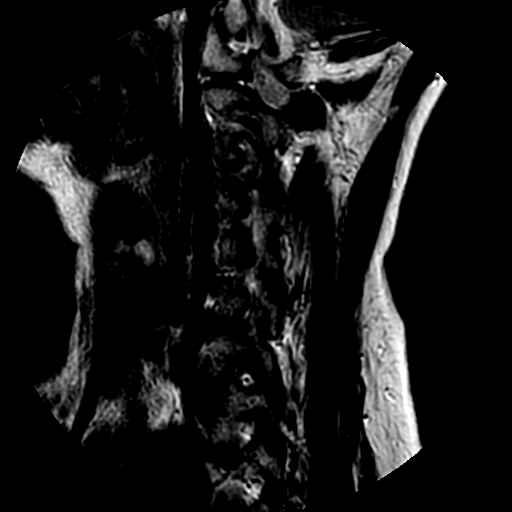
[im 15/15]
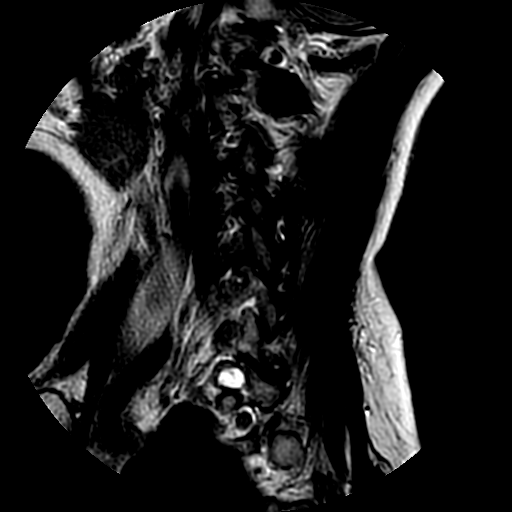

[Series 501: stir_(person_name) · sagittal · 3.0mm · 0.45mm/px · 6 of 15 slices shown]
[im 1/15]
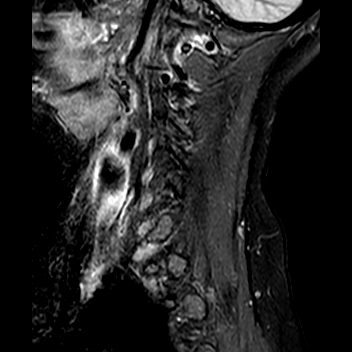
[im 3/15]
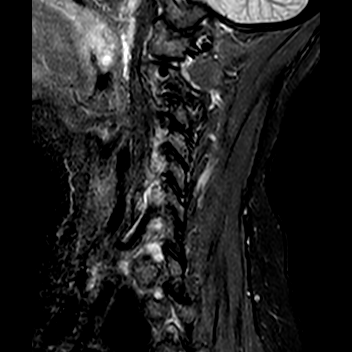
[im 6/15]
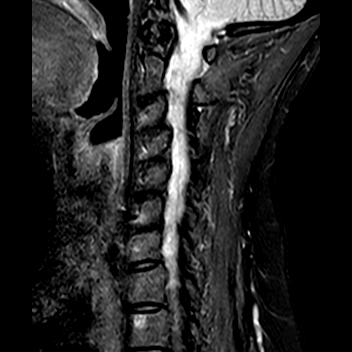
[im 9/15]
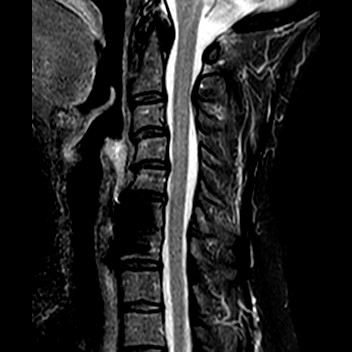
[im 12/15]
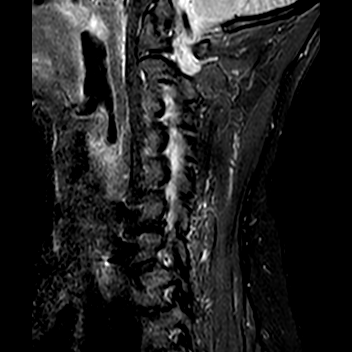
[im 15/15]
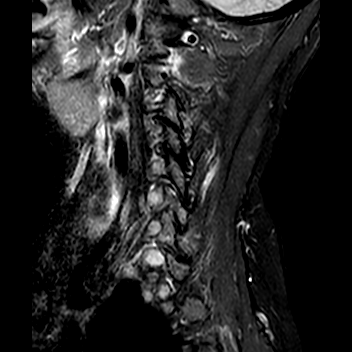

[Series 601: t2_(person_name)_(person_name) · axial · 3.0mm · 0.29mm/px · 1 of 45 slices shown]
[im 3/45]
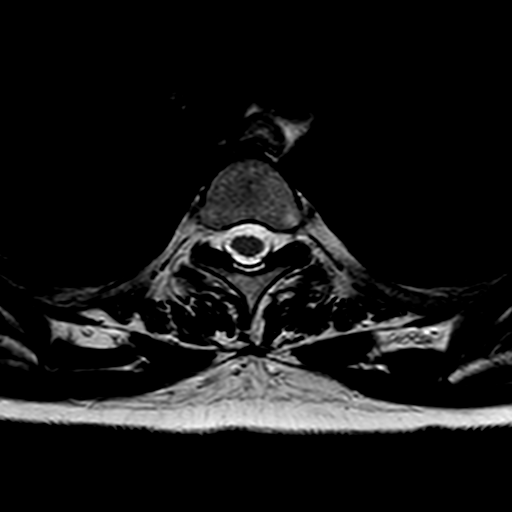

[30 of 48 positions shown; findings below may reference images not displayed]

FINDINGS: -------------------------------------------------------------------------------- 
----------------- 
GENERAL: C6-C7 ACDF. Loss of the normal cervical lordosis with mild cervical 
kyphosis, apex posterior at C4-C5. 2 mm anterolisthesis C3 on C4. Otherwise 
there is anatomic sagittal alignment. Vertebral body height preserved. No 
fracture. Normal marrow signal. No marrow or ligamentous edema. No abnormal cord 
signal. The no evidence of active facet arthritis. The no paraspinous 
abnormality. Anatomic coronal alignment. 
-------------------------------------------------------------------------------- 
---------------- 
SEGMENTAL: 
CRANIOCERVICAL JUNCTION: No significant stenosis. 
C2-C3: Normal disc height. Minimal generalized disc osteophyte complex. Canal 
and right foramina patent. There is moderate left-sided foraminal narrowing with 
left greater than right facet arthropathy. 
C3-C4: Mild loss of disc height. Anterolisthesis with disc and covering and 
generalized disc osteophyte complex that nearly abuts the ventral cord margin. 
Minimal canal stenosis. Bilateral uncinate spurring with mild right and moderate 
to severe left foraminal narrowing. Left greater than right facet arthropathy. 
C4-C5: Mild loss of disc height. Generalized disc osteophyte complex abuts the 
ventral cord margin with minimal canal narrowing. Patent right foramen. Moderate 
to severe left foraminal narrowing. Mild bilateral facet arthropathy. 
C5-C6: Mild loss of disc height. Generalized disc osteophyte complex nearly 
abuts the ventral cord margin but only with minimal canal stenosis. Mild 
bilateral foraminal narrowing. Normal facets. 
C6-C7: Fusion level. Canal patent. There is significant bilateral uncinate 
spurring with moderate to severe right and severe left foraminal narrowing. 
Facet arthropathy. 
C7-T1: Mild loss of disc height. Canal and foramina are patent. Bilateral nerve 
root sleeve cysts incidentally noted. Normal facets. 
Visualized upper thoracic segments are unremarkable. 
-------------------------------------------------------------------------------- 
---------------
IMPRESSION: C6-C7 ACDF. 
C2-C3, moderate left foraminal narrowing. 
C3-C4, moderate to severe left foraminal narrowing. 
C4-C5, moderate to severe left foraminal narrowing. 
C6-C7, moderate to severe right and severe left foraminal narrowing. 
No significant or critical central canal narrowing. No abnormal cord signal.
